# Patient Record
Sex: Female | Born: 1985 | Race: Black or African American | Hispanic: No | Smoking: Former smoker
Health system: Southern US, Community
[De-identification: ages and names within clinical notes are randomized; demographics above are authoritative.]

## PROBLEM LIST (undated history)

## (undated) DIAGNOSIS — N809 Endometriosis, unspecified: Secondary | ICD-10-CM

## (undated) DIAGNOSIS — G35 Multiple sclerosis: Secondary | ICD-10-CM

## (undated) HISTORY — PX: ANKLE SURGERY: SHX546

## (undated) HISTORY — PX: TUBAL LIGATION: SHX77

---
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
## 2007-02-10 ENCOUNTER — Emergency Department: Payer: Self-pay | Admitting: Emergency Medicine

## 2007-03-02 ENCOUNTER — Emergency Department: Payer: Self-pay | Admitting: Emergency Medicine

## 2007-06-24 ENCOUNTER — Emergency Department: Payer: Self-pay | Admitting: Emergency Medicine

## 2007-10-20 ENCOUNTER — Emergency Department: Payer: Self-pay | Admitting: Emergency Medicine

## 2008-03-18 ENCOUNTER — Emergency Department: Payer: Self-pay | Admitting: Emergency Medicine

## 2008-06-22 ENCOUNTER — Encounter: Payer: Self-pay | Admitting: Orthopedic Surgery

## 2008-06-23 ENCOUNTER — Encounter: Payer: Self-pay | Admitting: Orthopedic Surgery

## 2008-07-24 ENCOUNTER — Encounter: Payer: Self-pay | Admitting: Orthopedic Surgery

## 2008-11-30 ENCOUNTER — Emergency Department: Payer: Self-pay | Admitting: Emergency Medicine

## 2009-02-05 ENCOUNTER — Emergency Department: Payer: Self-pay | Admitting: Emergency Medicine

## 2009-08-05 ENCOUNTER — Emergency Department: Payer: Self-pay | Admitting: Internal Medicine

## 2010-05-09 ENCOUNTER — Emergency Department: Payer: Self-pay | Admitting: Emergency Medicine

## 2010-11-13 ENCOUNTER — Emergency Department: Payer: Self-pay | Admitting: Internal Medicine

## 2011-11-20 ENCOUNTER — Emergency Department: Payer: Self-pay | Admitting: Emergency Medicine

## 2011-11-20 LAB — URINALYSIS, COMPLETE
Bacteria: NONE SEEN
Blood: NEGATIVE
Ketone: NEGATIVE
Nitrite: NEGATIVE
Ph: 7 (ref 4.5–8.0)
Protein: 30
Specific Gravity: 1.029 (ref 1.003–1.030)
WBC UR: 1 /HPF (ref 0–5)

## 2011-11-20 LAB — COMPREHENSIVE METABOLIC PANEL
Albumin: 4.1 g/dL (ref 3.4–5.0)
Alkaline Phosphatase: 34 U/L — ABNORMAL LOW (ref 50–136)
Calcium, Total: 9.1 mg/dL (ref 8.5–10.1)
Glucose: 96 mg/dL (ref 65–99)
Potassium: 4 mmol/L (ref 3.5–5.1)
SGOT(AST): 17 U/L (ref 15–37)
SGPT (ALT): 16 U/L

## 2011-11-20 LAB — CBC
HCT: 42.1 % (ref 35.0–47.0)
HGB: 14.5 g/dL (ref 12.0–16.0)
MCHC: 34.4 g/dL (ref 32.0–36.0)
MCV: 98 fL (ref 80–100)
Platelet: 239 10*3/uL (ref 150–440)
RBC: 4.31 10*6/uL (ref 3.80–5.20)

## 2011-11-20 LAB — WET PREP, GENITAL

## 2012-04-21 ENCOUNTER — Emergency Department: Payer: Self-pay | Admitting: Emergency Medicine

## 2012-06-05 ENCOUNTER — Emergency Department: Payer: Self-pay | Admitting: Emergency Medicine

## 2012-06-05 LAB — URINALYSIS, COMPLETE
Bilirubin,UR: NEGATIVE
Blood: NEGATIVE
Leukocyte Esterase: NEGATIVE
Nitrite: NEGATIVE
Ph: 7 (ref 4.5–8.0)
Specific Gravity: 1.023 (ref 1.003–1.030)
Squamous Epithelial: <1

## 2012-06-05 LAB — CBC
HGB: 14 g/dL (ref 12.0–16.0)
MCHC: 35.1 g/dL (ref 32.0–36.0)
MCV: 96 fL (ref 80–100)
Platelet: 252 10*3/uL (ref 150–440)
RBC: 4.14 10*6/uL (ref 3.80–5.20)
RDW: 13 % (ref 11.5–14.5)
WBC: 10.4 10*3/uL (ref 3.6–11.0)

## 2012-06-05 LAB — WET PREP, GENITAL

## 2012-06-11 ENCOUNTER — Ambulatory Visit: Payer: Self-pay | Admitting: Obstetrics and Gynecology

## 2012-06-11 LAB — URINALYSIS, COMPLETE
Bilirubin,UR: NEGATIVE
Leukocyte Esterase: NEGATIVE
Ph: 6 (ref 4.5–8.0)
Protein: NEGATIVE
RBC,UR: <1 /HPF (ref 0–5)
Squamous Epithelial: 4

## 2012-06-11 LAB — HEMOGLOBIN: HGB: 13.2 g/dL (ref 12.0–16.0)

## 2012-06-12 ENCOUNTER — Ambulatory Visit: Payer: Self-pay | Admitting: Obstetrics and Gynecology

## 2012-06-16 ENCOUNTER — Emergency Department: Payer: Self-pay | Admitting: *Deleted

## 2012-06-16 LAB — PATHOLOGY REPORT

## 2012-06-17 LAB — COMPREHENSIVE METABOLIC PANEL
Albumin: 3.4 g/dL (ref 3.4–5.0)
Alkaline Phosphatase: 37 U/L — ABNORMAL LOW (ref 50–136)
Anion Gap: 9 (ref 7–16)
Calcium, Total: 8.6 mg/dL (ref 8.5–10.1)
EGFR (Non-African Amer.): >60
Glucose: 94 mg/dL (ref 65–99)
SGOT(AST): 23 U/L (ref 15–37)
SGPT (ALT): 31 U/L (ref 12–78)

## 2012-06-17 LAB — CBC WITH DIFFERENTIAL/PLATELET
Basophil #: 0.1 10*3/uL (ref 0.0–0.1)
Eosinophil #: 0.2 10*3/uL (ref 0.0–0.7)
Eosinophil %: 2.3 %
HGB: 12.2 g/dL (ref 12.0–16.0)
Lymphocyte #: 2.7 10*3/uL (ref 1.0–3.6)
Lymphocyte %: 25.8 %
MCHC: 34.7 g/dL (ref 32.0–36.0)
MCV: 96 fL (ref 80–100)
Neutrophil #: 6.9 10*3/uL — ABNORMAL HIGH (ref 1.4–6.5)
Platelet: 240 10*3/uL (ref 150–440)
RBC: 3.64 10*6/uL — ABNORMAL LOW (ref 3.80–5.20)
RDW: 12.7 % (ref 11.5–14.5)
WBC: 10.5 10*3/uL (ref 3.6–11.0)

## 2012-06-17 LAB — URINALYSIS, COMPLETE
Bilirubin,UR: NEGATIVE
Glucose,UR: NEGATIVE mg/dL (ref 0–75)
Leukocyte Esterase: NEGATIVE
Ph: 5 (ref 4.5–8.0)
Protein: NEGATIVE
RBC,UR: 160 /HPF (ref 0–5)
Squamous Epithelial: 3

## 2013-07-22 ENCOUNTER — Emergency Department: Payer: Self-pay | Admitting: Emergency Medicine

## 2013-07-22 LAB — CBC
HGB: 13.6 g/dL (ref 12.0–16.0)
MCH: 32.5 pg (ref 26.0–34.0)
MCV: 97 fL (ref 80–100)
RBC: 4.17 10*6/uL (ref 3.80–5.20)
RDW: 13.3 % (ref 11.5–14.5)
WBC: 12.3 10*3/uL — ABNORMAL HIGH (ref 3.6–11.0)

## 2013-07-22 LAB — COMPREHENSIVE METABOLIC PANEL
Albumin: 3.5 g/dL (ref 3.4–5.0)
Alkaline Phosphatase: 51 U/L (ref 50–136)
BUN: 20 mg/dL — ABNORMAL HIGH (ref 7–18)
Co2: 28 mmol/L (ref 21–32)
EGFR (African American): >60
Glucose: 94 mg/dL (ref 65–99)
SGPT (ALT): 17 U/L (ref 12–78)
Sodium: 138 mmol/L (ref 136–145)
Total Protein: 7 g/dL (ref 6.4–8.2)

## 2013-07-22 LAB — URINALYSIS, COMPLETE
Bilirubin,UR: NEGATIVE
Glucose,UR: NEGATIVE mg/dL (ref 0–75)
Nitrite: NEGATIVE
Ph: 5 (ref 4.5–8.0)
RBC,UR: 132 /HPF (ref 0–5)
Specific Gravity: 1.025 (ref 1.003–1.030)
Squamous Epithelial: 7

## 2013-07-22 LAB — LIPASE, BLOOD: Lipase: 88 U/L (ref 73–393)

## 2013-07-22 LAB — GC/CHLAMYDIA PROBE AMP

## 2014-04-15 ENCOUNTER — Emergency Department: Payer: Self-pay

## 2014-04-15 LAB — URINALYSIS, COMPLETE
BACTERIA: NONE SEEN
Bilirubin,UR: NEGATIVE
Blood: NEGATIVE
GLUCOSE, UR: NEGATIVE mg/dL (ref 0–75)
Ketone: NEGATIVE
Leukocyte Esterase: NEGATIVE
Nitrite: NEGATIVE
PH: 7 (ref 4.5–8.0)
PROTEIN: NEGATIVE
SPECIFIC GRAVITY: 1.013 (ref 1.003–1.030)
Squamous Epithelial: 2

## 2014-06-11 ENCOUNTER — Observation Stay: Payer: Self-pay | Admitting: Obstetrics and Gynecology

## 2014-06-11 LAB — URINALYSIS, COMPLETE
BILIRUBIN, UR: NEGATIVE
Blood: NEGATIVE
Glucose,UR: NEGATIVE mg/dL (ref 0–75)
LEUKOCYTE ESTERASE: NEGATIVE
Nitrite: NEGATIVE
Ph: 5 (ref 4.5–8.0)
Protein: NEGATIVE
RBC,UR: 1 /HPF (ref 0–5)
SPECIFIC GRAVITY: 1.027 (ref 1.003–1.030)
WBC UR: 3 /HPF (ref 0–5)

## 2014-06-23 ENCOUNTER — Emergency Department: Payer: Self-pay | Admitting: Emergency Medicine

## 2014-06-23 LAB — BASIC METABOLIC PANEL
ANION GAP: 6 — AB (ref 7–16)
BUN: 12 mg/dL (ref 7–18)
CALCIUM: 8.5 mg/dL (ref 8.5–10.1)
CO2: 28 mmol/L (ref 21–32)
CREATININE: 0.74 mg/dL (ref 0.60–1.30)
Chloride: 103 mmol/L (ref 98–107)
EGFR (African American): >60
EGFR (Non-African Amer.): >60
Glucose: 59 mg/dL — ABNORMAL LOW (ref 65–99)
Osmolality: 271 (ref 275–301)
POTASSIUM: 3.7 mmol/L (ref 3.5–5.1)
SODIUM: 137 mmol/L (ref 136–145)

## 2014-06-23 LAB — TROPONIN I: Troponin-I: <0.02 ng/mL

## 2014-06-23 LAB — CBC
HCT: 39.2 % (ref 35.0–47.0)
HGB: 13.1 g/dL (ref 12.0–16.0)
MCH: 32.6 pg (ref 26.0–34.0)
MCHC: 33.4 g/dL (ref 32.0–36.0)
MCV: 98 fL (ref 80–100)
Platelet: 241 10*3/uL (ref 150–440)
RBC: 4.01 10*6/uL (ref 3.80–5.20)
RDW: 13 % (ref 11.5–14.5)
WBC: 15.2 10*3/uL — ABNORMAL HIGH (ref 3.6–11.0)

## 2014-07-07 ENCOUNTER — Observation Stay: Payer: Self-pay | Admitting: Obstetrics and Gynecology

## 2014-09-05 ENCOUNTER — Observation Stay: Payer: Self-pay | Admitting: Obstetrics and Gynecology

## 2014-09-05 LAB — URINALYSIS, COMPLETE
Bacteria: NONE SEEN
Bilirubin,UR: NEGATIVE
Blood: NEGATIVE
Glucose,UR: NEGATIVE mg/dL (ref 0–75)
KETONE: NEGATIVE
Leukocyte Esterase: NEGATIVE
Nitrite: NEGATIVE
PH: 7 (ref 4.5–8.0)
Protein: NEGATIVE
RBC,UR: NONE SEEN /HPF (ref 0–5)
Specific Gravity: 1.005 (ref 1.003–1.030)
WBC UR: <1 /HPF (ref 0–5)

## 2014-09-05 LAB — DRUG SCREEN, URINE
AMPHETAMINES, UR SCREEN: NEGATIVE (ref ?–1000)
BENZODIAZEPINE, UR SCRN: NEGATIVE (ref ?–200)
Barbiturates, Ur Screen: NEGATIVE (ref ?–200)
CANNABINOID 50 NG, UR ~~LOC~~: NEGATIVE (ref ?–50)
COCAINE METABOLITE, UR ~~LOC~~: NEGATIVE (ref ?–300)
MDMA (Ecstasy)Ur Screen: NEGATIVE (ref ?–500)
Methadone, Ur Screen: NEGATIVE (ref ?–300)
Opiate, Ur Screen: NEGATIVE (ref ?–300)
Phencyclidine (PCP) Ur S: NEGATIVE (ref ?–25)
Tricyclic, Ur Screen: NEGATIVE (ref ?–1000)

## 2014-10-02 ENCOUNTER — Observation Stay: Payer: Self-pay

## 2014-10-02 LAB — URINALYSIS, COMPLETE
BLOOD: NEGATIVE
Bacteria: NONE SEEN
Bilirubin,UR: NEGATIVE
Glucose,UR: NEGATIVE mg/dL (ref 0–75)
Ketone: NEGATIVE
NITRITE: NEGATIVE
PH: 8 (ref 4.5–8.0)
Protein: NEGATIVE
RBC,UR: NONE SEEN /HPF (ref 0–5)
Specific Gravity: 1.004 (ref 1.003–1.030)
Squamous Epithelial: <1
WBC UR: 11 /HPF (ref 0–5)

## 2014-10-10 ENCOUNTER — Inpatient Hospital Stay: Payer: Self-pay | Admitting: Obstetrics and Gynecology

## 2014-10-10 LAB — CBC WITH DIFFERENTIAL/PLATELET
BASOS ABS: 0.1 10*3/uL (ref 0.0–0.1)
Basophil %: 0.5 %
Eosinophil #: 0 10*3/uL (ref 0.0–0.7)
Eosinophil %: 0.3 %
HCT: 37.1 % (ref 35.0–47.0)
HGB: 12.3 g/dL (ref 12.0–16.0)
LYMPHS ABS: 1.4 10*3/uL (ref 1.0–3.6)
Lymphocyte %: 9.2 %
MCH: 32.2 pg (ref 26.0–34.0)
MCHC: 33.2 g/dL (ref 32.0–36.0)
MCV: 97 fL (ref 80–100)
MONO ABS: 1.1 x10 3/mm — AB (ref 0.2–0.9)
MONOS PCT: 7.4 %
NEUTROS ABS: 12.4 10*3/uL — AB (ref 1.4–6.5)
Neutrophil %: 82.6 %
PLATELETS: 269 10*3/uL (ref 150–440)
RBC: 3.83 10*6/uL (ref 3.80–5.20)
RDW: 13.6 % (ref 11.5–14.5)
WBC: 15 10*3/uL — AB (ref 3.6–11.0)

## 2014-10-10 LAB — GC/CHLAMYDIA PROBE AMP

## 2014-10-11 LAB — HEMATOCRIT: HCT: 33.6 % — ABNORMAL LOW (ref 35.0–47.0)

## 2015-01-10 NOTE — Op Note (Signed)
PATIENT NAME:  Christie Oconnor, Christie Oconnor MR#:  [id number] DATE OF BIRTH:  08/08/1986  DATE OF PROCEDURE:  06/12/2012  PREOPERATIVE DIAGNOSIS: Incomplete spontaneous abortion.   POSTOPERATIVE DIAGNOSIS: Incomplete spontaneous abortion.   PROCEDURE: Suction D and C.   SURGEON: Ricky L. Logan Bores, MD   ANESTHESIA: General endotracheal.   FINDINGS: Blue-gray tissue consistent with products of conception.   ESTIMATED BLOOD LOSS: Minimal.   COMPLICATIONS: None.   DRAINS: In and out catheterization with a red rubber at the end of the case with 200 mL of urine.   PROCEDURE IN DETAIL: The patient was found to have an incomplete abortion. I discussed the options, and she elected to proceed with proceed with dilation and curettage. Consent was signed. She was taken to the Operating Room and placed in supine position where anesthesia was initiated. She received 1 gram of Ancef preoperatively. She was placed in the dorsal lithotomy position, prepped and draped in the usual sterile fashion. The cervix was visualized, grasped with a single-tooth tenaculum, dilated to permit a #8 suction curette which was passed with immediate return of tissue. Gentle curettage with a sharp curette was carried out which showed good uterine cry. One additional pass of the suction curette was made with passage of a small remaining amount of tissue, and again good uterine cry was noted throughout. The instruments were removed. The uterus and cervix were seen to be hemostatic. The bladder was drained, and the patient was turned to supine position and left in the care of Anesthesia.     I anticipate a routine postoperative course. The patient will be started on doxycycline tomorrow and given pain medicine to be taken as needed.  Routine precautions and follow-up in my office in two weeks.   ____________________________ Reatha Harps. Logan Bores, MD rle:cbb D: 06/12/2012 15:19:07 ET T: 06/12/2012 17:33:00 ET JOB#: [id number]  cc: Ricky L.  Logan Bores, MD, <Dictator> Augustina Mood MD ELECTRONICALLY SIGNED 06/14/2012 11:29

## 2015-01-22 NOTE — Op Note (Signed)
PATIENT NAME:  Christie Oconnor, Christie Oconnor MR#:  [id number] DATE OF BIRTH:  05/26/1986  DATE OF PROCEDURE:  10/10/2014  PREOPERATIVE DIAGNOSES: Nonreassuring fetal heart tracing.   POSTOPERATIVE DIAGNOSIS: Nonreassuring fetal heart tracing, status post primary low transverse cesarean section.  PROCEDURE:  Primary low transverse cesarean section.   ANESTHESIA: Spinal.   SURGEON:  Hershall Benkert S. Valentino Saxon, MD   ESTIMATED BLOOD LOSS: 400 mL.   URINE OUTPUT: 200 mL.   INTRAVENOUS FLUIDS: 1000 mL.   COMPLICATIONS: None.   FINDINGS: There was clear amniotic fluid at rupture. Nuchal cord x 1 that was reducible. Small anterior subserosal fundal fibroid approximately 1 x 1 cm. There were filmy adhesions of  bilateral adnexa, otherwise normal-appearing uterine outline, tubes and ovaries. There was a normal-appearing placenta.   SPECIMEN: Cord gas.   CONDITION: Stable.   DESCRIPTION OF PROCEDURE:  The patient was taken to the operating room where she was placed under spinal anesthesia without difficulty. A timeout was held and the above information was confirmed. After induction of anesthesia, the patient received 2 grams of Ancef. She was then prepped and draped in normal sterile fashion in dorsal supine position with leftward tilt. The spinal was found to be adequate.   Next, a Pfannenstiel incision was made and carried down through the subcutaneous tissue to the fascia with the Bovie. The fascia was then incised in the midline and incision was extended laterally.  The superior aspect of the fascial incision was then grasped with Kocher clamps, elevated, and the rectus muscles were dissected off superiorly using the Mayo scissors.   Attention was then turned inferiorly, which in a similar fashion was grasped, tented up with Kocher clamps, and the rectus muscles dissected off bluntly and using the Mayo scissors, the rectus muscles were then separated in the midline. The peritoneum was identified and entered  bluntly. The peritoneal incision was then extended superiorly and inferiorly with good visualization of the bladder. The uterovesical peritoneal reflection was then incised transversely and the bladder flap was bluntly freed from the lower uterine segment. The bladder blade was then reinserted. Low transverse uterine incision was then made along the lower uterine segment with the scalpel. The uterine incision was then extended laterally and superiorly bluntly. The bladder blade was removed and the infant's head was delivered atraumatically. There was a nuchal cord x 1 that was noted which was reducible. The nose and mouth were suctioned. The cord was clamped and cut and infant was handed off to the pediatricians, delivered from cephalic presentation was a 2870 gram female with Apgars of 8 and 9. After the umbilical cord was clamped and cut, cord gas was initially obtained due to the appearance of fetal distress. This was collected and sent. After this, the placenta was then removed manually and intact and appeared normal. The uterus was exteriorized and cleared of all clots and debris. The uterine outline, tubes, and ovaries appeared normal with the exception of the noted subserosal fundal fibroid on the anterior aspect of the uterus.   The uterine incision was then closed using running locked sutures of 0 Vicryl. Hemostasis was observed. A second layer of 0 Vicryl was used for an imbricating layer for added hemostasis. Next, the filmy adhesions that were noted on the adnexa were then taken down using the Bovie with good hemostasis noted. The uterus was then returned to the patient's abdomen. Lavage was carried out until clear. The gutters were cleared of all clots. The peritoneal edges were then approximated using  a 3-0 Vicryl, also incorporating the rectus muscles. The fascia was then reapproximated with running suture of 1-0 Vicryl. The skin was approximated with 4-0 Monocryl. Liquiband dressing was then placed  over the incision. A Lidoderm patch was placed above and below the incision line and a pressure dressing was placed. Instrument, sponge, and needle counts were correct prior to abdominal closure and at the conclusion of the case.    ____________________________ Jacques Earthly. Valentino Saxon, MD asc:LT D: 10/10/2014 21:36:05 ET T: 10/10/2014 21:52:20 ET JOB#: [id number]  cc: Jacques Earthly. Valentino Saxon, MD, <Dictator> Fabian November MD ELECTRONICALLY SIGNED 10/17/2014 11:24
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
## 2015-06-05 ENCOUNTER — Other Ambulatory Visit: Payer: Self-pay | Admitting: Family Medicine

## 2015-06-05 DIAGNOSIS — Z3482 Encounter for supervision of other normal pregnancy, second trimester: Secondary | ICD-10-CM

## 2015-06-08 ENCOUNTER — Ambulatory Visit
Admission: RE | Admit: 2015-06-08 | Discharge: 2015-06-08 | Disposition: A | Discharge status: Home / Self Care | Payer: Medicaid Other | Source: Ambulatory Visit | Attending: Family Medicine | Admitting: Family Medicine

## 2015-06-08 ENCOUNTER — Ambulatory Visit: Payer: Self-pay

## 2015-06-08 DIAGNOSIS — Z3A19 19 weeks gestation of pregnancy: Secondary | ICD-10-CM | POA: Diagnosis not present

## 2015-06-08 DIAGNOSIS — Z3482 Encounter for supervision of other normal pregnancy, second trimester: Secondary | ICD-10-CM | POA: Diagnosis present

## 2018-12-13 ENCOUNTER — Other Ambulatory Visit: Payer: Self-pay

## 2018-12-13 ENCOUNTER — Emergency Department: Payer: Self-pay

## 2018-12-13 ENCOUNTER — Emergency Department
Admission: EM | Admit: 2018-12-13 | Discharge: 2018-12-13 | Disposition: A | Discharge status: Home / Self Care | Payer: Self-pay | Attending: Emergency Medicine | Admitting: Emergency Medicine

## 2018-12-13 ENCOUNTER — Encounter: Payer: Self-pay | Admitting: Emergency Medicine

## 2018-12-13 DIAGNOSIS — Y939 Activity, unspecified: Secondary | ICD-10-CM | POA: Insufficient documentation

## 2018-12-13 DIAGNOSIS — Y999 Unspecified external cause status: Secondary | ICD-10-CM | POA: Insufficient documentation

## 2018-12-13 DIAGNOSIS — Y929 Unspecified place or not applicable: Secondary | ICD-10-CM | POA: Insufficient documentation

## 2018-12-13 DIAGNOSIS — S93401A Sprain of unspecified ligament of right ankle, initial encounter: Secondary | ICD-10-CM | POA: Insufficient documentation

## 2018-12-13 DIAGNOSIS — Y33XXXA Other specified events, undetermined intent, initial encounter: Secondary | ICD-10-CM | POA: Insufficient documentation

## 2018-12-13 NOTE — ED Notes (Signed)
.  This RN reviewed discharge instructions, follow-up care, OTC pain relievers, splint use, crutch use, cryotherapy, and need for elevation with patient. Patient verbalized understanding of all reviewed information.  Patient stable, with no distress noted at this time.
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
## 2018-12-13 NOTE — ED Triage Notes (Signed)
Pt reports right ankle pain for 48 hrs. Pt reports poor balance. Pt talks in complete sentences no distress noted.
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
## 2019-03-18 ENCOUNTER — Emergency Department
Admission: EM | Admit: 2019-03-18 | Discharge: 2019-03-18 | Disposition: A | Discharge status: Home / Self Care | Payer: Self-pay | Attending: Emergency Medicine | Admitting: Emergency Medicine

## 2019-03-18 ENCOUNTER — Encounter: Payer: Self-pay | Admitting: Emergency Medicine

## 2019-03-18 ENCOUNTER — Other Ambulatory Visit: Payer: Self-pay

## 2019-03-18 DIAGNOSIS — J020 Streptococcal pharyngitis: Secondary | ICD-10-CM | POA: Insufficient documentation

## 2019-03-18 DIAGNOSIS — Z20828 Contact with and (suspected) exposure to other viral communicable diseases: Secondary | ICD-10-CM | POA: Insufficient documentation

## 2019-03-18 DIAGNOSIS — F17200 Nicotine dependence, unspecified, uncomplicated: Secondary | ICD-10-CM | POA: Insufficient documentation

## 2019-03-18 LAB — CBC
HCT: 39.6 % (ref 36.0–46.0)
Hemoglobin: 13.2 g/dL (ref 12.0–15.0)
MCH: 32.9 pg (ref 26.0–34.0)
MCHC: 33.3 g/dL (ref 30.0–36.0)
MCV: 98.8 fL (ref 80.0–100.0)
Platelets: 236 10*3/uL (ref 150–400)
RBC: 4.01 MIL/uL (ref 3.87–5.11)
RDW: 14.1 % (ref 11.5–15.5)
WBC: 18.2 10*3/uL — ABNORMAL HIGH (ref 4.0–10.5)
nRBC: 0 % (ref 0.0–0.2)

## 2019-03-18 LAB — COMPREHENSIVE METABOLIC PANEL
ALT: 15 U/L (ref 0–44)
AST: 15 U/L (ref 15–41)
Albumin: 3.9 g/dL (ref 3.5–5.0)
Alkaline Phosphatase: 51 U/L (ref 38–126)
Anion gap: 9 (ref 5–15)
BUN: 12 mg/dL (ref 6–20)
CO2: 26 mmol/L (ref 22–32)
Calcium: 8.8 mg/dL — ABNORMAL LOW (ref 8.9–10.3)
Chloride: 102 mmol/L (ref 98–111)
Creatinine, Ser: 0.83 mg/dL (ref 0.44–1.00)
GFR calc Af Amer: >60 mL/min (ref >60)
GFR calc non Af Amer: >60 mL/min (ref >60)
Glucose, Bld: 112 mg/dL — ABNORMAL HIGH (ref 70–99)
Potassium: 3.3 mmol/L — ABNORMAL LOW (ref 3.5–5.1)
Sodium: 137 mmol/L (ref 135–145)
Total Bilirubin: 1 mg/dL (ref 0.3–1.2)
Total Protein: 7.7 g/dL (ref 6.5–8.1)

## 2019-03-18 LAB — GROUP A STREP BY PCR: Group A Strep by PCR: DETECTED — AB

## 2019-03-18 LAB — SARS CORONAVIRUS 2 BY RT PCR (HOSPITAL ORDER, PERFORMED IN ~~LOC~~ HOSPITAL LAB): SARS Coronavirus 2: NEGATIVE

## 2019-03-18 LAB — LIPASE, BLOOD: Lipase: 29 U/L (ref 11–51)

## 2019-03-18 MED ORDER — CEPHALEXIN 500 MG PO CAPS
500.0000 mg | ORAL_CAPSULE | Freq: Once | ORAL | Status: AC
  Administered 2019-03-18: 13:00:00 500 mg via ORAL
  Filled 2019-03-18: qty 1

## 2019-03-18 MED ORDER — SODIUM CHLORIDE 0.9% FLUSH: 3.0000 mL | Freq: Once | INTRAVENOUS | Status: DC

## 2019-03-18 MED ORDER — CEPHALEXIN 500 MG PO CAPS: 500.0000 mg | ORAL_CAPSULE | Freq: Two times a day (BID) | ORAL | 0 refills | Status: AC

## 2019-03-18 MED ORDER — ACETAMINOPHEN 500 MG PO TABS
1000.0000 mg | ORAL_TABLET | Freq: Once | ORAL | Status: AC
  Administered 2019-03-18: 12:00:00 1000 mg via ORAL
  Filled 2019-03-18: qty 2

## 2019-03-18 NOTE — ED Triage Notes (Signed)
First RN Note: Pt presents to ED via POV with c/o sore throat, night sweats, and reports 3 days ago "I was vomiting real bad". Pt denies active vomiting at this time. Pt c/o swelling to L side of her neck at this time. Pt noted to have hoarse voice at this time.

## 2019-03-18 NOTE — ED Provider Notes (Signed)
[facility] Emergency Department Provider Note  Time seen: 11:20 AM  I have reviewed the triage vital signs and the nursing notes.   HISTORY  Chief Complaint Sore Throat and Emesis    HPI Christie Oconnor is a 33 y.o. female with no significant past medical history presents to the emergency department for sore throat fever slight cough.  According to the patient for the past 3 days she has been experiencing a sore throat, left greater than right along with low-grade fevers, intermittent body aches and a slight cough.  Patient denies any shortness of breath.  Denies any chest pain.  Denies any history of strep throat.  Denies any known sick contacts.   History reviewed. No pertinent past medical history.  There are no active problems to display for this patient.   Past Surgical History:  Procedure Laterality Date  . ANKLE SURGERY Left   . CESAREAN SECTION     x 2  . TUBAL LIGATION      Prior to Admission medications   Not on File    No Known Allergies  No family history on file.  Social History Social History   Tobacco Use  . Smoking status: Current Every Day Smoker  . Smokeless tobacco: Never Used  Substance Use Topics  . Alcohol use: Yes  . Drug use: Not on file    Review of Systems Constitutional: Fever x3 days ENT: Sore throat left greater than right Cardiovascular: Negative for chest pain. Respiratory: Negative for shortness of breath.  Slight cough per patient. Gastrointestinal: Negative for abdominal pain Musculoskeletal: Negative for musculoskeletal complaints Skin: Negative for skin complaints  Neurological: Negative for headache All other ROS negative  ____________________________________________   PHYSICAL EXAM:  VITAL SIGNS: ED Triage Vitals  Enc Vitals Group     BP 03/18/19 1002 120/78     Pulse Rate 03/18/19 1002 90     Resp 03/18/19 1002 16     Temp 03/18/19 1002 99.3 F (37.4 C)     Temp Source 03/18/19  1002 Oral     SpO2 03/18/19 1002 99 %     Weight 03/18/19 1001 168 lb (76.2 kg)     Height 03/18/19 1001 5\' 7"  (1.702 m)     Head Circumference --      Peak Flow --      Pain Score 03/18/19 1003 8     Pain Loc --      Pain Edu? --      Excl. in GC? --    Constitutional: Alert and oriented. Well appearing and in no distress. Eyes: Normal exam ENT      Head: Normocephalic and atraumatic.      Mouth/Throat: Mucous membranes are moist.  Patient has erythematous tonsils bilaterally with bilateral exudate.  No unilateral hypertrophy, midline uvula. Cardiovascular: Normal rate, regular rhythm.  Respiratory: Normal respiratory effort without tachypnea nor retractions. Breath sounds are clear Gastrointestinal: Soft and nontender. No distention.  Musculoskeletal: Nontender with normal range of motion in all extremities.  Neurologic:  Normal speech and language. No gross focal neurologic deficits Skin:  Skin is warm, dry and intact.  Psychiatric: Mood and affect are normal.   ____________________________________________   INITIAL IMPRESSION / ASSESSMENT AND PLAN / ED COURSE  Pertinent labs & imaging results that were available during my care of the patient were reviewed by me and considered in my medical decision making (see chart for details).   Patient presents emergency department for sore throat  fever chills and body aches.  Differential would include pharyngitis, streptococcal pharyngitis, coronavirus, tonsillitis.  We will check for strep throat, we will check for coronavirus and continue to closely monitor.  Patient's lab work shows a moderate leukocytosis largely within normal limits otherwise.   Patient's strep test is positive.  Cover test is negative.  We will discharge on Keflex.  Patient agreeable to plan of care.  Christie Oconnor was evaluated in Emergency Department on 03/18/2019 for the symptoms described in the history of present illness. She was evaluated in the context  of the global COVID-19 pandemic, which necessitated consideration that the patient might be at risk for infection with the SARS-CoV-2 virus that causes COVID-19. Institutional protocols and algorithms that pertain to the evaluation of patients at risk for COVID-19 are in a state of rapid change based on information released by regulatory bodies including the CDC and federal and state organizations. These policies and algorithms were followed during the patient's care in the ED.  ____________________________________________   FINAL CLINICAL IMPRESSION(S) / ED DIAGNOSES  Pharyngitis Streptococcal pharyngitis   Harvest Dark, MD 03/18/19 1233

## 2019-03-18 NOTE — ED Notes (Signed)
This RN notified by lab that blood sent down had incorrect stickers. Pt pulled back by Judson Roch, EDT to recollect blood.

## 2019-07-15 ENCOUNTER — Other Ambulatory Visit: Payer: Self-pay

## 2019-07-15 ENCOUNTER — Encounter: Payer: Self-pay | Admitting: Radiology

## 2019-07-15 ENCOUNTER — Emergency Department: Payer: Medicaid Other

## 2019-07-15 ENCOUNTER — Inpatient Hospital Stay
Admission: EM | Admit: 2019-07-15 | Discharge: 2019-07-19 | DRG: 060 | Disposition: A | Discharge status: Home / Self Care | Payer: Medicaid Other | Attending: Internal Medicine | Admitting: Internal Medicine

## 2019-07-15 DIAGNOSIS — G8194 Hemiplegia, unspecified affecting left nondominant side: Secondary | ICD-10-CM | POA: Diagnosis present

## 2019-07-15 DIAGNOSIS — Z818 Family history of other mental and behavioral disorders: Secondary | ICD-10-CM

## 2019-07-15 DIAGNOSIS — G35 Multiple sclerosis: Principal | ICD-10-CM | POA: Diagnosis present

## 2019-07-15 DIAGNOSIS — F1210 Cannabis abuse, uncomplicated: Secondary | ICD-10-CM | POA: Diagnosis present

## 2019-07-15 DIAGNOSIS — F17200 Nicotine dependence, unspecified, uncomplicated: Secondary | ICD-10-CM | POA: Diagnosis present

## 2019-07-15 DIAGNOSIS — R531 Weakness: Secondary | ICD-10-CM

## 2019-07-15 DIAGNOSIS — R569 Unspecified convulsions: Secondary | ICD-10-CM | POA: Diagnosis present

## 2019-07-15 DIAGNOSIS — R29705 NIHSS score 5: Secondary | ICD-10-CM | POA: Diagnosis present

## 2019-07-15 DIAGNOSIS — Z20828 Contact with and (suspected) exposure to other viral communicable diseases: Secondary | ICD-10-CM | POA: Diagnosis present

## 2019-07-15 DIAGNOSIS — I639 Cerebral infarction, unspecified: Secondary | ICD-10-CM | POA: Diagnosis present

## 2019-07-15 DIAGNOSIS — Z833 Family history of diabetes mellitus: Secondary | ICD-10-CM | POA: Diagnosis not present

## 2019-07-15 DIAGNOSIS — R29810 Facial weakness: Secondary | ICD-10-CM | POA: Diagnosis present

## 2019-07-15 DIAGNOSIS — Z823 Family history of stroke: Secondary | ICD-10-CM | POA: Diagnosis not present

## 2019-07-15 DIAGNOSIS — Z825 Family history of asthma and other chronic lower respiratory diseases: Secondary | ICD-10-CM | POA: Diagnosis not present

## 2019-07-15 DIAGNOSIS — R471 Dysarthria and anarthria: Secondary | ICD-10-CM | POA: Diagnosis present

## 2019-07-15 LAB — DIFFERENTIAL
Abs Immature Granulocytes: 0.03 10*3/uL (ref 0.00–0.07)
Basophils Absolute: 0 10*3/uL (ref 0.0–0.1)
Basophils Relative: 0 %
Eosinophils Absolute: 0.2 10*3/uL (ref 0.0–0.5)
Eosinophils Relative: 2 %
Immature Granulocytes: 0 %
Lymphocytes Relative: 28 %
Lymphs Abs: 2.9 10*3/uL (ref 0.7–4.0)
Monocytes Absolute: 0.6 10*3/uL (ref 0.1–1.0)
Monocytes Relative: 6 %
Neutro Abs: 6.6 10*3/uL (ref 1.7–7.7)
Neutrophils Relative %: 64 %

## 2019-07-15 LAB — CBC
HCT: 38.9 % (ref 36.0–46.0)
Hemoglobin: 13.2 g/dL (ref 12.0–15.0)
MCH: 32.4 pg (ref 26.0–34.0)
MCHC: 33.9 g/dL (ref 30.0–36.0)
MCV: 95.3 fL (ref 80.0–100.0)
Platelets: 257 10*3/uL (ref 150–400)
RBC: 4.08 MIL/uL (ref 3.87–5.11)
RDW: 13.9 % (ref 11.5–15.5)
WBC: 10.3 10*3/uL (ref 4.0–10.5)
nRBC: 0 % (ref 0.0–0.2)

## 2019-07-15 LAB — COMPREHENSIVE METABOLIC PANEL
ALT: 15 U/L (ref 0–44)
AST: 21 U/L (ref 15–41)
Albumin: 3.8 g/dL (ref 3.5–5.0)
Alkaline Phosphatase: 38 U/L (ref 38–126)
Anion gap: 13 (ref 5–15)
BUN: 20 mg/dL (ref 6–20)
CO2: 21 mmol/L — ABNORMAL LOW (ref 22–32)
Calcium: 8.8 mg/dL — ABNORMAL LOW (ref 8.9–10.3)
Chloride: 103 mmol/L (ref 98–111)
Creatinine, Ser: 0.88 mg/dL (ref 0.44–1.00)
GFR calc Af Amer: >60 mL/min (ref >60)
GFR calc non Af Amer: >60 mL/min (ref >60)
Glucose, Bld: 76 mg/dL (ref 70–99)
Potassium: 3.7 mmol/L (ref 3.5–5.1)
Sodium: 137 mmol/L (ref 135–145)
Total Bilirubin: 0.7 mg/dL (ref 0.3–1.2)
Total Protein: 7.1 g/dL (ref 6.5–8.1)

## 2019-07-15 LAB — URINALYSIS, ROUTINE W REFLEX MICROSCOPIC
Bacteria, UA: NONE SEEN
Bilirubin Urine: NEGATIVE
Glucose, UA: >=500 mg/dL — AB
Hgb urine dipstick: NEGATIVE
Ketones, ur: NEGATIVE mg/dL
Leukocytes,Ua: NEGATIVE
Nitrite: NEGATIVE
Protein, ur: NEGATIVE mg/dL
Specific Gravity, Urine: 1.021 (ref 1.005–1.030)
pH: 6 (ref 5.0–8.0)

## 2019-07-15 LAB — GLUCOSE, CAPILLARY
Glucose-Capillary: 66 mg/dL — ABNORMAL LOW (ref 70–99)
Glucose-Capillary: 83 mg/dL (ref 70–99)

## 2019-07-15 LAB — PROTIME-INR
INR: 1 (ref 0.8–1.2)
Prothrombin Time: 13.1 seconds (ref 11.4–15.2)

## 2019-07-15 LAB — APTT: aPTT: 31 seconds (ref 24–36)

## 2019-07-15 MED ORDER — LEVETIRACETAM IN NACL 1000 MG/100ML IV SOLN
1000.0000 mg | Freq: Once | INTRAVENOUS | Status: AC
  Administered 2019-07-15: 1000 mg via INTRAVENOUS
  Filled 2019-07-15: qty 100

## 2019-07-15 MED ORDER — ASPIRIN EC 325 MG PO TBEC
325.0000 mg | DELAYED_RELEASE_TABLET | Freq: Every day | ORAL | Status: DC
  Administered 2019-07-16 – 2019-07-19 (×4): 325 mg via ORAL
  Filled 2019-07-15 (×4): qty 1

## 2019-07-15 MED ORDER — PROCHLORPERAZINE EDISYLATE 10 MG/2ML IJ SOLN
10.0000 mg | Freq: Once | INTRAMUSCULAR | Status: AC
  Administered 2019-07-15: 23:00:00 10 mg via INTRAVENOUS
  Filled 2019-07-15: qty 2

## 2019-07-15 MED ORDER — ENOXAPARIN SODIUM 40 MG/0.4ML ~~LOC~~ SOLN
40.0000 mg | SUBCUTANEOUS | Status: DC
  Administered 2019-07-16 – 2019-07-18 (×4): 40 mg via SUBCUTANEOUS
  Filled 2019-07-15 (×4): qty 0.4

## 2019-07-15 MED ORDER — ACETAMINOPHEN 650 MG RE SUPP: 650.0000 mg | RECTAL | Status: DC | PRN

## 2019-07-15 MED ORDER — ONDANSETRON HCL 4 MG/2ML IJ SOLN: 4.0000 mg | INTRAMUSCULAR | Status: DC | PRN

## 2019-07-15 MED ORDER — LEVETIRACETAM 500 MG PO TABS
1000.0000 mg | ORAL_TABLET | Freq: Two times a day (BID) | ORAL | Status: DC
  Administered 2019-07-16 – 2019-07-17 (×3): 1000 mg via ORAL
  Filled 2019-07-15 (×4): qty 2

## 2019-07-15 MED ORDER — ACETAMINOPHEN 325 MG PO TABS
650.0000 mg | ORAL_TABLET | Freq: Once | ORAL | Status: AC
  Administered 2019-07-15: 650 mg via ORAL
  Filled 2019-07-15: qty 2

## 2019-07-15 MED ORDER — STROKE: EARLY STAGES OF RECOVERY BOOK
Freq: Once | Status: AC
  Administered 2019-07-16: 02:00:00

## 2019-07-15 MED ORDER — ACETAMINOPHEN 160 MG/5ML PO SOLN
650.0000 mg | ORAL | Status: DC | PRN
  Filled 2019-07-15: qty 20.3

## 2019-07-15 MED ORDER — ASPIRIN 81 MG PO CHEW
81.0000 mg | CHEWABLE_TABLET | Freq: Once | ORAL | Status: AC
  Administered 2019-07-15: 23:00:00 81 mg via ORAL
  Filled 2019-07-15: qty 1

## 2019-07-15 MED ORDER — ASPIRIN 300 MG RE SUPP: 300.0000 mg | Freq: Every day | RECTAL | Status: DC

## 2019-07-15 MED ORDER — IOHEXOL 350 MG/ML SOLN
75.0000 mL | Freq: Once | INTRAVENOUS | Status: AC | PRN
  Administered 2019-07-15: 75 mL via INTRAVENOUS

## 2019-07-15 MED ORDER — SODIUM CHLORIDE 0.9 % IV SOLN
INTRAVENOUS | Status: DC
  Administered 2019-07-16 (×2): via INTRAVENOUS

## 2019-07-15 MED ORDER — DEXTROSE 50 % IV SOLN
1.0000 | Freq: Once | INTRAVENOUS | Status: AC
  Administered 2019-07-15: 50 mL via INTRAVENOUS

## 2019-07-15 MED ORDER — SENNOSIDES-DOCUSATE SODIUM 8.6-50 MG PO TABS: 1.0000 | ORAL_TABLET | Freq: Every evening | ORAL | Status: DC | PRN

## 2019-07-15 MED ORDER — ACETAMINOPHEN 325 MG PO TABS
650.0000 mg | ORAL_TABLET | ORAL | Status: DC | PRN
  Administered 2019-07-17 – 2019-07-19 (×4): 650 mg via ORAL
  Filled 2019-07-15 (×4): qty 2

## 2019-07-15 NOTE — ED Notes (Signed)
Patient transported to CT 

## 2019-07-15 NOTE — ED Notes (Signed)
Patient declines tPA, code stroke cancelled

## 2019-07-15 NOTE — ED Notes (Signed)
ED TO INPATIENT HANDOFF REPORT  ED Nurse Name and Phone #: Betti CruzKate  S Name/Age/Gender Christie EhlersJennifer R Oconnor 33 y.o. female Room/Bed: ED08A/ED08A  Code Status   Code Status: Full Code  Home/SNF/Other Home {Patient oriented WJ:XBJYto:self, place, time, situation Is this baseline? Yes   Triage Complete: Triage complete  Chief Complaint AMS  Triage Note Pt to ED from home via [facility] c/o AMS that started at [employer]1900 tonight.  Per EMS patient was sitting on couch speaking with mom and suddenly started acting differently.  Upon arrival patient slow to respond verbally, decreased attention.  EMS vitals 120/90 BP, 70 HR, 98% RA, 74 CBG, 98.7 temp.  Upon arrival patient assessed by EDP and found to have weakness in left arm and leg, states tingling and decreased left sided sensation.  Pt also states had several sips of alcohol, possibly margarita.   Allergies No Known Allergies  Level of Care/Admitting Diagnosis ED Disposition    ED Disposition Condition Comment   Admit  [facility]ospital [facility] [[id number]]  Level of Care: Med-Surg [16]  Covid Evaluation: Asymptomatic Screening Protocol (No Symptoms)  Diagnosis: CVA (cerebral vascular accident) [facility](HCC) [[id number]]  Admitting Physician: Hannah BeatMANSY, JAN A [[id number]]  Attending Physician: Hannah BeatMANSY, JAN A [[id number]]  Estimated length of stay: past midnight tomorrow  Certification:: I certify this patient will need inpatient services for at least 2 midnights  PT Class (Do Not Modify): Inpatient [101]  PT Acc Code (Do Not Modify): Private [1]       B Medical/Surgery History History reviewed. No pertinent past medical history. Past Surgical History:  Procedure Laterality Date  . ANKLE SURGERY Left   . CESAREAN SECTION     x 2  . TUBAL LIGATION       A IV Location/Drains/Wounds Patient Lines/Drains/Airways Status   Active Line/Drains/Airways    Name:   Placement date:   Placement time:   Site:   Days:   Peripheral IV  07/15/19 Left Antecubital   07/15/19    2147    Antecubital   less than 1          Intake/Output Last 24 hours No intake or output data in the 24 hours ending 07/15/19 2350  Labs/Imaging Results for orders placed or performed during the hospital encounter of 07/15/19 (from the past 48 hour(s))  Glucose, capillary     Status: Abnormal   Collection Time: 07/15/19  9:22 PM  Result Value Ref Range   Glucose-Capillary 66 (L) 70 - 99 mg/dL  Protime-INR     Status: None   Collection Time: 07/15/19  9:27 PM  Result Value Ref Range   Prothrombin Time 13.1 11.4 - 15.2 seconds   INR 1.0 0.8 - 1.2    Comment: (NOTE) INR goal varies based on device and disease states. Performed at [facility], [address] [place], [place] [address]   APTT     Status: None   Collection Time: 07/15/19  9:27 PM  Result Value Ref Range   aPTT 31 24 - 36 seconds    Comment: Performed at [facility], [address] [place], [place] [address]  CBC     Status: None   Collection Time: 07/15/19  9:27 PM  Result Value Ref Range   WBC 10.3 4.0 - 10.5 K/uL   RBC 4.08 3.87 - 5.11 MIL/uL   Hemoglobin 13.2 12.0 - 15.0 g/dL   HCT 01.038.9 27.236.0 - 53.646.0 %   MCV 95.3 80.0 -  100.0 fL   MCH 32.4 26.0 - 34.0 pg   MCHC 33.9 30.0 - 36.0 g/dL   RDW 47.813.9 29.511.5 - 62.115.5 %   Platelets 257 150 - 400 K/uL   nRBC 0.0 0.0 - 0.2 %    Comment: Performed at [facility], [address] [place], [place] [address]  Differential     Status: None   Collection Time: 07/15/19  9:27 PM  Result Value Ref Range   Neutrophils Relative % 64 %   Neutro Abs 6.6 1.7 - 7.7 K/uL   Lymphocytes Relative 28 %   Lymphs Abs 2.9 0.7 - 4.0 K/uL   Monocytes Relative 6 %   Monocytes Absolute 0.6 0.1 - 1.0 K/uL   Eosinophils Relative 2 %   Eosinophils Absolute 0.2 0.0 - 0.5 K/uL   Basophils Relative 0 %   Basophils Absolute 0.0 0.0 - 0.1 K/uL   Immature Granulocytes 0 %   Abs Immature Granulocytes 0.03 0.00 - 0.07 K/uL     Comment: Performed at [facility], [address] [place], [place] [address]  Comprehensive metabolic panel     Status: Abnormal   Collection Time: 07/15/19  9:27 PM  Result Value Ref Range   Sodium 137 135 - 145 mmol/L   Potassium 3.7 3.5 - 5.1 mmol/L    Comment: HEMOLYSIS AT THIS LEVEL MAY AFFECT RESULT   Chloride 103 98 - 111 mmol/L   CO2 21 (L) 22 - 32 mmol/L   Glucose, Bld 76 70 - 99 mg/dL   BUN 20 6 - 20 mg/dL   Creatinine, Ser 6.290.88 0.44 - 1.00 mg/dL   Calcium 8.8 (L) 8.9 - 10.3 mg/dL   Total Protein 7.1 6.5 - 8.1 g/dL   Albumin 3.8 3.5 - 5.0 g/dL   AST 21 15 - 41 U/L   ALT 15 0 - 44 U/L   Alkaline Phosphatase 38 38 - 126 U/L   Total Bilirubin 0.7 0.3 - 1.2 mg/dL   GFR calc non Af Amer >60 >60 mL/min   GFR calc Af Amer >60 >60 mL/min   Anion gap 13 5 - 15    Comment: Performed at [facility], [address] [place], [place] [address]  Glucose, capillary     Status: None   Collection Time: 07/15/19 10:07 PM  Result Value Ref Range   Glucose-Capillary 83 70 - 99 mg/dL   Comment 1 Document in Chart   Urinalysis, Routine w reflex microscopic     Status: Abnormal   Collection Time: 07/15/19 10:18 PM  Result Value Ref Range   Color, Urine STRAW (A) YELLOW   APPearance CLEAR (A) CLEAR   Specific Gravity, Urine 1.021 1.005 - 1.030   pH 6.0 5.0 - 8.0   Glucose, UA >=500 (A) NEGATIVE mg/dL   Hgb urine dipstick NEGATIVE NEGATIVE   Bilirubin Urine NEGATIVE NEGATIVE   Ketones, ur NEGATIVE NEGATIVE mg/dL   Protein, ur NEGATIVE NEGATIVE mg/dL   Nitrite NEGATIVE NEGATIVE   Leukocytes,Ua NEGATIVE NEGATIVE   WBC, UA 0-5 0 - 5 WBC/hpf   Bacteria, UA NONE SEEN NONE SEEN   Squamous Epithelial / LPF 0-5 0 - 5    Comment: Performed at [facility], [address] [place], [place] [address]   No results found.  Pending Labs Unresulted Labs (From admission, onward)    Start     Ordered   07/16/19 0500  Hemoglobin A1c  Tomorrow morning,   STAT  07/15/19 2334   07/16/19 0500  Lipid panel  Tomorrow morning,   STAT    Comments: Fasting    07/15/19 2334   07/15/19 2328  HIV Antibody (routine testing w rflx)  (HIV Antibody (Routine testing w reflex) panel)  Once,   STAT     07/15/19 2334   07/15/19 2323  Pregnancy, urine  Add-on,   AD     07/15/19 2322   07/15/19 2257  SARS CORONAVIRUS 2 (TAT 6-24 HRS) Nasopharyngeal Nasopharyngeal Swab  (Asymptomatic/Tier 2 Patients Labs)  Once,   STAT    Question Answer Comment  Is this test for diagnosis or screening Screening   Symptomatic for COVID-19 as defined by CDC No   Hospitalized for COVID-19 No   Admitted to ICU for COVID-19 No   Previously tested for COVID-19 Yes   Resident in a congregate (group) care setting No   Employed in healthcare setting No   Pregnant No      07/15/19 2256   07/15/19 2215  Ethanol  Once,   STAT     07/15/19 2215          Vitals/Pain Today's Vitals   07/15/19 2146 07/15/19 2157 07/15/19 2200 07/15/19 2300  BP:   119/83 128/81  Pulse:   72 91  Resp:   (!) 21 18  Temp:  98.5 F (36.9 C)    TempSrc:  Oral    SpO2:   99% 96%  Weight:    76.2 kg  PainSc: 0-No pain       Isolation Precautions No active isolations  Medications Medications   stroke: mapping our early stages of recovery book (has no administration in time range)  0.9 %  sodium chloride infusion (has no administration in time range)  acetaminophen (TYLENOL) tablet 650 mg (has no administration in time range)    Or  acetaminophen (TYLENOL) solution 650 mg (has no administration in time range)    Or  acetaminophen (TYLENOL) suppository 650 mg (has no administration in time range)  senna-docusate (Senokot-S) tablet 1 tablet (has no administration in time range)  enoxaparin (LOVENOX) injection 40 mg (has no administration in time range)  aspirin suppository 300 mg (has no administration in time range)    Or  aspirin EC tablet 325 mg (has no administration in time range)   levETIRAcetam (KEPPRA) tablet 1,000 mg (has no administration in time range)  ondansetron (ZOFRAN) injection 4 mg (has no administration in time range)  dextrose 50 % solution 50 mL (50 mLs Intravenous Given 07/15/19 2130)  iohexol (OMNIPAQUE) 350 MG/ML injection 75 mL (75 mLs Intravenous Contrast Given 07/15/19 2142)  levETIRAcetam (KEPPRA) IVPB 1000 mg/100 mL premix (0 mg Intravenous Stopped 07/15/19 2335)  aspirin chewable tablet 81 mg (81 mg Oral Given 07/15/19 2316)  prochlorperazine (COMPAZINE) injection 10 mg (10 mg Intravenous Given 07/15/19 2319)  acetaminophen (TYLENOL) tablet 650 mg (650 mg Oral Given 07/15/19 2316)    Mobility walks with person assist Low fall risk   Focused Assessments Neuro Assessment Handoff:  Swallow screen pass? Yes    NIH Stroke Scale ( + Modified Stroke Scale Criteria)  Interval: Neuro change Level of Consciousness (1a.)   : Alert, keenly responsive LOC Questions (1b. )   +: Answers both questions correctly LOC Commands (1c. )   + : Performs both tasks correctly Best Gaze (2. )  +: Normal Visual (3. )  +: No visual loss Facial Palsy (4. )    : Normal symmetrical movements  Motor Arm, Left (5a. )   +: Drift Motor Arm, Right (5b. )   +: No drift Motor Leg, Left (6a. )   +: No drift Motor Leg, Right (6b. )   +: No drift Limb Ataxia (7. ): Absent Sensory (8. )   +: Mild-to-moderate sensory loss, patient feels pinprick is less sharp or is dull on the affected side, or there is a loss of superficial pain with pinprick, but patient is aware of being touched Best Language (9. )   +: No aphasia Dysarthria (10. ): Normal Extinction/Inattention (11.)   +: No Abnormality Modified SS Total  +: 2 Complete NIHSS TOTAL: 2 Last date known well: 07/15/19 Last time known well: 1900 Neuro Assessment:   Neuro Checks:   Neuro change (07/15/19 2220)  Last Documented NIHSS Modified Score: 2 (07/15/19 2220) Has TPA been given? No If patient is a Neuro Trauma  and patient is going to OR before floor call report to [facility] nurse: [phone] or [phone]     R Recommendations: See Admitting Provider Note  Report given to:   Additional Notes:

## 2019-07-15 NOTE — ED Notes (Signed)
Neuro MD discussing tPA administration

## 2019-07-15 NOTE — ED Notes (Signed)
Teleneuro provider on screen, 1017 provider asking questions  Patient alert but drowsy

## 2019-07-15 NOTE — ED Notes (Signed)
Code stroke button hitx2

## 2019-07-15 NOTE — ED Notes (Signed)
CT arrival 2138

## 2019-07-15 NOTE — ED Provider Notes (Signed)
[facility] Emergency Department Provider Note  ____________________________________________   First MD Initiated Contact with Patient 07/15/19 2113     (approximate)  I have reviewed the triage vital signs and the nursing notes.   HISTORY  Chief Complaint Left-sided weakness.   HPI Christie Oconnor is a 33 y.o. female otherwise healthy who presents with left-sided weakness and altered mental status.  Per family per EMS patient was last seen normal at 63 PM-ish.  Patient then started having left-sided tingling and left-sided weakness in the leg. Weakness mild, nothing makes it better or worse.  The also noted her to be more confused than normal.  She denies any drug use.  Says she took 3 sips of a margarita but denies drinking anything else.  She denies any fevers, urinary symptoms, vaginal bleeding.            History reviewed. No pertinent past medical history.  There are no active problems to display for this patient.   Past Surgical History:  Procedure Laterality Date  . ANKLE SURGERY Left   . CESAREAN SECTION     x 2  . TUBAL LIGATION      Prior to Admission medications   Not on File    Allergies Patient has no known allergies.  No family history on file.  Social History Social History   Tobacco Use  . Smoking status: Current Every Day Smoker  . Smokeless tobacco: Never Used  Substance Use Topics  . Alcohol use: Yes  . Drug use: Not on file      Review of Systems Constitutional: No fever/chills Eyes: No visual changes. ENT: No sore throat. Cardiovascular: Denies chest pain. Respiratory: Denies shortness of breath. Gastrointestinal: No abdominal pain.  No nausea, no vomiting.  No diarrhea.  No constipation. Genitourinary: Negative for dysuria. Musculoskeletal: Negative for back pain. Skin: Negative for rash. Neurological: + left sided weakness and left sided numbness All other ROS negative  ____________________________________________   PHYSICAL EXAM:  VITAL SIGNS: Blood pressure 119/83, pulse 72, temperature 98.5 F (36.9 C), temperature source Oral, resp. rate (!) 21, SpO2 99 %.   Constitutional: Alert and oriented. Well appearing and in no acute distress. Eyes: Conjunctivae are normal. EOMI. Head: Atraumatic. Nose: No congestion/rhinnorhea. Mouth/Throat: Mucous membranes are moist.   Neck: No stridor. Trachea Midline. FROM Cardiovascular: Normal rate, regular rhythm. Grossly normal heart sounds.  Good peripheral circulation. Respiratory: Normal respiratory effort.  No retractions. Lungs CTAB. Gastrointestinal: Soft and nontender. No distention. No abdominal bruits.  Musculoskeletal: No lower extremity tenderness nor edema.  No joint effusions. Neurologic:  Normal speech and language. Left sided weakness and tingling. And right facial droop.   Skin:  Skin is warm, dry and intact. No rash noted. Psychiatric: Mood and affect are normal. Speech and behavior are normal. GU: Deferred   ____________________________________________   LABS (all labs ordered are listed, but only abnormal results are displayed)  Labs Reviewed  COMPREHENSIVE METABOLIC PANEL - Abnormal; Notable for the following components:      Result Value   CO2 21 (*)    Calcium 8.8 (*)    All other components within normal limits  URINALYSIS, ROUTINE W REFLEX MICROSCOPIC - Abnormal; Notable for the following components:   Color, Urine STRAW (*)    APPearance CLEAR (*)    Glucose, UA >=500 (*)    All other components within normal limits  GLUCOSE, CAPILLARY - Abnormal; Notable for the following components:   Glucose-Capillary 66 (*)  All other components within normal limits  SARS CORONAVIRUS 2 (TAT 6-24 HRS)  PROTIME-INR  APTT  CBC  DIFFERENTIAL  GLUCOSE, CAPILLARY  ETHANOL  I-STAT CREATININE, ED  POC URINE PREG, ED  POC URINE PREG, ED   ____________________________________________    ED ECG REPORT I, Concha Se, the attending physician, personally viewed and interpreted this ECG.  EKG is normal sinus rate of 78, no ST elevation, T wave version in V2, normal intervals ____________________________________________  RADIOLOGY  ED MD interpretation: CT scans are negative.   ____________________________________________   PROCEDURES  Procedure(s) performed (including Critical Care):  Procedures   ____________________________________________   INITIAL IMPRESSION / ASSESSMENT AND PLAN / ED COURSE  Christie Oconnor was evaluated in Emergency Department on 07/15/2019 for the symptoms described in the history of present illness. She was evaluated in the context of the global COVID-19 pandemic, which necessitated consideration that the patient might be at risk for infection with the SARS-CoV-2 virus that causes COVID-19. Institutional protocols and algorithms that pertain to the evaluation of patients at risk for COVID-19 are in a state of rapid change based on information released by regulatory bodies including the CDC and federal and state organizations. These policies and algorithms were followed during the patient's care in the ED.    Patient is a 32 year old who presents with left sided weakness and sensation changes on the left side that started today.  Last known normal was 7 PM so she is within the TPA window.  Will get CT head as well as CT dissection to make sure this is not from a dissection or LVO and discussed with the stroke team.  Will get labs to evaluate for anemia, electrolyte abnormalities, AKI.  Patient does not have any risk factors for stroke but her exam is definitely concerning.  Patient also reports having 2 episodes previously of passing out and the concern for may be these were seizure.  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  Sugar was in the 60s so given amp of dextrose to ensure that this is not secondary to hypoglycemia.  NIHSS  is 5.   CT head and CTA are negative.  Discussed with neurology Dr. Adrienne Mocha who offered TPA but patient declined   recommends that patient be admitted for MRI, echo, EEG, gets a baby aspirin now and load of 1000 mg of Keppra.  Patient now having a headache so maybe this is a complex migraine.  Will give some Compazine.  Discussed with the hospital team for admission.   ____________________________________________   FINAL CLINICAL IMPRESSION(S) / ED DIAGNOSES   Final diagnoses:  Left-sided weakness      MEDICATIONS GIVEN DURING THIS VISIT:  Medications  levETIRAcetam (KEPPRA) IVPB 1000 mg/100 mL premix (has no administration in time range)  aspirin chewable tablet 81 mg (has no administration in time range)  prochlorperazine (COMPAZINE) injection 10 mg (has no administration in time range)  acetaminophen (TYLENOL) tablet 650 mg (has no administration in time range)  dextrose 50 % solution 50 mL (50 mLs Intravenous Given 07/15/19 2130)  iohexol (OMNIPAQUE) 350 MG/ML injection 75 mL (75 mLs Intravenous Contrast Given 07/15/19 2142)     ED Discharge Orders    None       Note:  This document was prepared using Dragon voice recognition software and may include unintentional dictation errors.   Concha Se, MD 07/15/19 [phone]

## 2019-07-15 NOTE — ED Notes (Signed)
Teleneuro team on screen at 2200

## 2019-07-15 NOTE — ED Triage Notes (Signed)
Pt to ED from home via [facility] c/o AMS that started at [employer].  Per EMS patient was sitting on couch speaking with mom and suddenly started acting differently.  Upon arrival patient slow to respond verbally, decreased attention.  EMS vitals 120/90 BP, 70 HR, 98% RA, 74 CBG, 98.7 temp.  Upon arrival patient assessed by EDP and found to have weakness in left arm and leg, states tingling and decreased left sided sensation.  Pt also states had several sips of alcohol, possibly margarita.

## 2019-07-15 NOTE — ED Notes (Signed)
Code stroke cart button hit x1

## 2019-07-15 NOTE — ED Notes (Signed)
Boyfriend at bedside

## 2019-07-15 NOTE — Progress Notes (Signed)
Ch received pg for code STROKE. Ch checked in with nurse secretary who shared that pt will be in room 8 after CT scan. Ch checked in with pt that was able to respond. Pt requested that her significant other be contacted for an update. F/U with care team and pt once screened.   07/15/19 2200  Clinical Encounter Type  Visited With Patient;Health care provider  Visit Type Critical Care;ED  Referral From Nurse  Consult/Referral To Chaplain  Stress Factors  Patient Stress Factors Health changes
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
## 2019-07-15 NOTE — ED Notes (Addendum)
According to patient, she had seizure in 2009 where she blacked out and had similar symptoms  Boyfriend reports patient was shaking esp L arm when EMS arrived, lasted during ambulance ride; lasted maybe 10 mins?

## 2019-07-16 ENCOUNTER — Inpatient Hospital Stay: Payer: Medicaid Other

## 2019-07-16 ENCOUNTER — Inpatient Hospital Stay
Admit: 2019-07-16 | Discharge: 2019-07-16 | Disposition: A | Discharge status: Home / Self Care | Payer: Medicaid Other | Attending: Family Medicine | Admitting: Family Medicine

## 2019-07-16 ENCOUNTER — Other Ambulatory Visit: Payer: Self-pay

## 2019-07-16 ENCOUNTER — Encounter: Payer: Self-pay | Admitting: Emergency Medicine

## 2019-07-16 DIAGNOSIS — R531 Weakness: Secondary | ICD-10-CM

## 2019-07-16 DIAGNOSIS — R569 Unspecified convulsions: Secondary | ICD-10-CM

## 2019-07-16 LAB — ECHOCARDIOGRAM COMPLETE
Height: 69 in
Weight: 2811.2 oz

## 2019-07-16 LAB — ETHANOL: Alcohol, Ethyl (B): <10 mg/dL (ref <10)

## 2019-07-16 LAB — URINE DRUG SCREEN, QUALITATIVE (ARMC ONLY)
Amphetamines, Ur Screen: NOT DETECTED
Barbiturates, Ur Screen: NOT DETECTED
Benzodiazepine, Ur Scrn: NOT DETECTED
Cannabinoid 50 Ng, Ur ~~LOC~~: POSITIVE — AB
Cocaine Metabolite,Ur ~~LOC~~: NOT DETECTED
MDMA (Ecstasy)Ur Screen: NOT DETECTED
Methadone Scn, Ur: NOT DETECTED
Opiate, Ur Screen: NOT DETECTED
Phencyclidine (PCP) Ur S: NOT DETECTED
Tricyclic, Ur Screen: NOT DETECTED

## 2019-07-16 LAB — LIPID PANEL
Cholesterol: 168 mg/dL (ref 0–200)
HDL: 63 mg/dL (ref >40)
LDL Cholesterol: 95 mg/dL (ref 0–99)
Total CHOL/HDL Ratio: 2.7 RATIO
Triglycerides: 50 mg/dL (ref <150)
VLDL: 10 mg/dL (ref 0–40)

## 2019-07-16 LAB — HIV ANTIBODY (ROUTINE TESTING W REFLEX): HIV Screen 4th Generation wRfx: NONREACTIVE

## 2019-07-16 LAB — HEMOGLOBIN A1C
Hgb A1c MFr Bld: 4.8 % (ref 4.8–5.6)
Mean Plasma Glucose: 91.06 mg/dL

## 2019-07-16 LAB — SARS CORONAVIRUS 2 (TAT 6-24 HRS): SARS Coronavirus 2: NEGATIVE

## 2019-07-16 LAB — PREGNANCY, URINE: Preg Test, Ur: NEGATIVE

## 2019-07-16 MED ORDER — ATORVASTATIN CALCIUM 20 MG PO TABS
10.0000 mg | ORAL_TABLET | Freq: Every day | ORAL | Status: DC
  Administered 2019-07-16 – 2019-07-18 (×3): 10 mg via ORAL
  Filled 2019-07-16 (×3): qty 1

## 2019-07-16 NOTE — Progress Notes (Signed)
*  PRELIMINARY RESULTS* Echocardiogram 2D Echocardiogram has been performed.  Sherrie Sport 07/16/2019, 9:40 AM

## 2019-07-16 NOTE — Procedures (Signed)
ELECTROENCEPHALOGRAM REPORT   Patient: Christie Oconnor       Room #: 130A-AA EEG No. ID: [id number] Age: 33 y.o.        Sex: female Referring Physician: [facility] Report Date:  07/16/2019        Interpreting Physician: Alexis Goodell  History: [name] is an 33 y.o. female with seizure like activity  Medications:  ASA, Keppra, Lipitor  Conditions of Recording:  This is a 21 channel routine scalp EEG performed with bipolar and monopolar montages arranged in accordance to the international 10/20 system of electrode placement. One channel was dedicated to EKG recording.  The patient is in the awake, drowsy and asleep states.  Description:  The waking background activity consists of a low voltage, symmetrical, fairly well organized, 10 Hz alpha activity, seen from the parieto-occipital and posterior temporal regions.  Low voltage fast activity, poorly organized, is seen anteriorly and is at times superimposed on more posterior regions.  A mixture of theta and alpha rhythms are seen from the central and temporal regions. The patient drowses with slowing to irregular, low voltage theta and beta activity.   The patient goes in to a light sleep with symmetrical sleep spindles, vertex central sharp transients and irregular slow activity. No epileptiform activity is noted.  Hyperventilation was not performed.  Intermittent photic stimulation was performed but failed to illicit any change in the tracing.    IMPRESSION: Normal electroencephalogram, awake, asleep and with activation procedures. There are no focal lateralizing or epileptiform features.   Alexis Goodell, MD Neurology [phone] 07/16/2019, 2:28 PM

## 2019-07-16 NOTE — H&P (Addendum)
[facility] - [place] at [facility]   PATIENT NAME: Christie Oconnor    MR#:  [id number]  DATE OF BIRTH:  09-Dec-1985  DATE OF ADMISSION:  07/15/2019  PRIMARY CARE PHYSICIAN: Patient, No Pcp Per   REQUESTING/REFERRING PHYSICIAN: Artis Delay, MD  CHIEF COMPLAINT:   Chief Complaint  Patient presents with  . Altered Mental Status  Left-sided weakness  HISTORY OF PRESENT ILLNESS:  Christie Oconnor  is a 33 y.o. African-American female with a known history of ongoing tobacco abuse as well as marijuana abuse, presented to [facility] left-sided weakness with associated left facial droop and slurred speech as well as headache with mild nausea without vomiting started about 7:30 PM she had associated mild dizziness and blurred vision.  No reported chest pain or palpitations.  The patient was fairly somnolent and therefore poor historian and most of the history was given by her boyfriend was with her.  By the time she got to the ambulance she was noted to have shaking of the left arm and suspected seizure.  No reported cough or wheezing or shortness of breath or fever or chills.  She did not have any tongue bites or urinary or stool incontinence.  No reported tinnitus or vertigo.  No reported paresthesias.  Upon presentation to the emergency room blood pressure was 137/93 with otherwise normal vital signs.  Labs revealed normal blood glucose and otherwise normal labs.  She unremarkable and the neck CTA.  EKG showed no sinus rhythm with a rate of 78.  She was seen by telemetry neurology and was thought to not be a candidate for TPA.  She was given a loading dose of 1 g of IV Keppra and the recommendation was for 1000 mg p.o. twice daily.  She was also given 10 mg IV Compazine, an ampule of D50, a baby aspirin and 60 mg of p.o. Tylenol.  She will be admitted to a medical monitored bed for further evaluation and management.   PAST MEDICAL HISTORY:  Ongoing tobacco  abuse Marijuana abuse  PAST SURGICAL HISTORY:   Past Surgical History:  Procedure Laterality Date  . ANKLE SURGERY Left   . CESAREAN SECTION     x 2  . TUBAL LIGATION      SOCIAL HISTORY:   Social History   Tobacco Use  . Smoking status: Current Every Day Smoker  . Smokeless tobacco: Never Used  Substance Use Topics  . Alcohol use: Yes    FAMILY HISTORY:  Positive for CVA in her mother and seizure in her uncle.  DRUG ALLERGIES:  No Known Allergies  REVIEW OF SYSTEMS:   ROS As per history of present illness. All pertinent systems were reviewed above. Constitutional,  HEENT, cardiovascular, respiratory, GI, GU, musculoskeletal, neuro, psychiatric, endocrine,  integumentary and hematologic systems were reviewed and are otherwise  negative/unremarkable except for positive findings mentioned above in the HPI.   MEDICATIONS AT HOME:   Prior to Admission medications   Not on File      VITAL SIGNS:  Blood pressure 109/69, pulse 82, temperature 98.5 F (36.9 C), temperature source Oral, resp. rate 20, weight 76.2 kg, SpO2 98 %.  PHYSICAL EXAMINATION:  Physical Exam  GENERAL:  33 y.o.-year-old African-American female patient lying in the bed with no acute distress.  EYES: Pupils equal, round, reactive to light and accommodation. No scleral icterus. Extraocular muscles intact.  HEENT: Head atraumatic, normocephalic. Oropharynx and nasopharynx clear.  NECK:  Supple, no jugular venous distention.  No thyroid enlargement, no tenderness.  LUNGS: Normal breath sounds bilaterally, no wheezing, rales,rhonchi or crepitation. No use of accessory muscles of respiration.  CARDIOVASCULAR: Regular rate and rhythm, S1, S2 normal. No murmurs, rubs, or gallops.  ABDOMEN: Soft, nondistended, nontender. Bowel sounds present. No organomegaly or mass.  EXTREMITIES: No pedal edema, cyanosis, or clubbing.  NEUROLOGIC: Cranial nerves II through XII are intact. Muscle strength was 4/5 in  left upper and 3/5 left lower extremity compared to 5/5 in the right upper and lower extremities.  She had normal sensory exam to light touch.  Gait was not tested. PSYCHIATRIC: The patient is alert and oriented x 3.  Normal affect and good eye contact. SKIN: No obvious rash, lesion, or ulcer.   LABORATORY PANEL:   CBC Recent Labs  Lab 07/15/19 2127  WBC 10.3  HGB 13.2  HCT 38.9  PLT 257   ------------------------------------------------------------------------------------------------------------------  Chemistries  Recent Labs  Lab 07/15/19 2127  NA 137  K 3.7  CL 103  CO2 21*  GLUCOSE 76  BUN 20  CREATININE 0.88  CALCIUM 8.8*  AST 21  ALT 15  ALKPHOS 38  BILITOT 0.7   ------------------------------------------------------------------------------------------------------------------  Cardiac Enzymes No results for input(s): TROPONINI in the last 168 hours. ------------------------------------------------------------------------------------------------------------------  RADIOLOGY:  No results found.    IMPRESSION AND PLAN:   1.  Left-sided hemiparesis with dysarthria concerning for possible right MCA territory infarction.The patient will be admitted to a medically monitored bed.  We will follow neuro checks q.4 hours for 24 hours.  The patient will be placed on aspirin.  Will obtain a brain MRI without contrast as well as bilateral carotid Doppler and 2D echo with bubble study .  A neurology consultation (when available) as well as physical/occupation/speech therapy consults will be obtained in a.m..  The patient will be placed on statin therapy and fasting lipids will be checked.  I notified Dr. Doy Mince about the patient.  2.  Seizure of new onset.  She will be placed on p.o. Keppra as mentioned above.  EEG will be obtained.  Neurology consult to be obtained.  3.  Ongoing tobacco abuse.  I counseled her for smoking cessation and she will receive further counseling  here.  4.  Marijuana abuse.  She was counseled for cessation.  5.  DVT prophylaxis.  Subtenons Lovenox.   All the records are reviewed and case discussed with ED provider. The plan of care was discussed in details with the patient (and family). I answered all questions. The patient agreed to proceed with the above mentioned plan. Further management will depend upon hospital course.   CODE STATUS: Full code  TOTAL TIME TAKING CARE OF THIS PATIENT: 50 minutes.    Christel Mormon M.D on 07/16/2019 at 12:14 AM  Pager - [phone]  After 6pm go to [email or web address] - Proofreader  [facility]  Office  [phone]  CC: Primary care physician; Patient, No Pcp Per   Note: This dictation was prepared with Dragon dictation along with smaller phrase technology. Any transcriptional errors that result from this process are unintentional.

## 2019-07-16 NOTE — Evaluation (Signed)
Occupational Therapy Evaluation Patient Details Name: Christie Oconnor MRN: [id number] DOB: [birth year] Today's Date: 07/16/2019    History of Present Illness Pt admitted for possible CVA, MRI (-) for acute infarct, however showed "Scattered periventricular deep white matter hyperintensities bilaterally". History of tobacco and marijuana use. Pt with complaints of L side weakness and AMS along with jerking episode.   Clinical Impression   Christie Oconnor was seen for OT evaluation this date. Prior to hospital admission, pt was independent in all aspects of ADL/IADL management. Pt endorses working and caring for her two young daughters. Pt lives with her boyfriend, mother, uncle, and children in a one-level home with three steps to enter. Currently pt demonstrates mild impairments in LUE/LLE strength, coordination, and sensation. Pt is R hand dominant and requires supervision to CGA assist to support her standing balance during completion of grooming ADLs at the sink this date. She is able to use a RW to ambulate to her room commode where she completes toileting and peri-care with supervision for safety. Pt received in clothing from home which she endorses she put on with increased time/effort prior to OT session. Pt is eager to return to her PLOF with increased independence and functional use of her left arm and leg. Pt educated on safe positioning of LUE in order to promote functional return, improve safety, and promote skin integrity on this date. No cognitive or visual deficits noted with assessment on this date. Pt would benefit from skilled OT to address noted impairments and functional limitations (see below for any additional details) in order to maximize safety and independence while minimizing falls risk and caregiver burden.  Upon hospital discharge, recommend pt discharge recommend HHOT to maximize pt safety and return to PLOF during meaningful occupations of daily life.     Follow Up  Recommendations  Home health OT    Equipment Recommendations  Tub/shower seat    Recommendations for Other Services       Precautions / Restrictions Precautions Precautions: Fall Restrictions Weight Bearing Restrictions: No      Mobility Bed Mobility Overal bed mobility: Needs Assistance Bed Mobility: Supine to Sit;Sit to Supine     Supine to sit: Supervision Sit to supine: Min guard   General bed mobility comments: Pt comes to sitting at EOB with increased time/effort. When returning to supine from seated position, pt noed to hold onto leg of her pants to pull her LLE into bed rather than lift it independently  Transfers Overall transfer level: Needs assistance Equipment used: Rolling walker (2 wheeled) Transfers: Sit to/from Stand Sit to Stand: Min guard;Supervision         General transfer comment: Pt completes STS with increased time/effort this date, but otherwise no physical assist needed from this therapist. She reports mild to moderate dizziness with transfers this date, but remains stable once standing. CGA provided occasionally during ambulation for pt safety. Pt noted to lean onto her LLE this date and requires prompting to more evenly distribute her weight across her BOS during functional activities.    Balance Overall balance assessment: Needs assistance Sitting-balance support: Feet supported Sitting balance-Leahy Scale: Fair Sitting balance - Comments: Pt steady in static sitting and can withstand a challenge during MMT, however pt noted to be have mild LOB during dynamic sitting tasks involving weight shifting toward the R.   Standing balance support: During functional activity;No upper extremity supported Standing balance-Leahy Scale: Fair Standing balance comment: Pt steady static standing at sink to complete oral care.  Requires occasional CGA with ambulation, but otherwise maintains balance well without increased sway. Pt ambulated using a RW and is  able to navigate her room with min VC's from this therapist for safety.                           ADL either performed or assessed with clinical judgement   ADL Overall ADL's : Needs assistance/impaired Eating/Feeding: Set up;Sitting;Supervision/ safety   Grooming: Supervision/safety;Set up;Standing;Oral care Grooming Details (indicate cue type and reason): Pt completes oral care while standing at sink this date. She is able to maintain standing balance with supervision to CGA for safety. She demonstrates decreased LUE FMC, but is able to complete oral care without physical assist from this therapist using BUE to apply toothpaste. Her caregiver at bedside encourages pt to attemmpt to brush with her L (nondominant) use, which she has significant difficulty doing this date.                 Toilet Transfer: RW;Min guard;Set up;Supervision/safety;Regular Teacher, adult educationToilet Toilet Transfer Details (indicate cue type and reason): Pt ambulates to room commode using a RW for stability this date. She is able to lower herself onto low commode using handrail for stability. Toileting- ArchitectClothing Manipulation and Hygiene: Min guard;Sit to/from stand;Sitting/lateral lean;Set up;Supervision/safety Toileting - Clothing Manipulation Details (indicate cue type and reason): Pt completes peri-care independently and completes clothing mgt with SBA for mgt of telemonitor this date.     Functional mobility during ADLs: Min guard;Supervision/safety;Rolling walker       Vision Baseline Vision/History: No visual deficits Patient Visual Report: (Pt reports 2-3 month hx of increased difficulty with visual acuity. She states this impacts her work, and on bad days she will use OTC readers. Reports she has not seen an eye doctor in ~8 years.)       Perception     Praxis      Pertinent Vitals/Pain Pain Assessment: No/denies pain Pain Intervention(s): Monitored during session     Hand Dominance Right    Extremity/Trunk Assessment Upper Extremity Assessment Upper Extremity Assessment: LUE deficits/detail LUE Deficits / Details: Decreased shoulder flexion/abduction this date 3+/5; Elbow flexion/extension and grip 4/5. Pt noted to have decreased FMC with thumb to finger opposition and finger to nose. Pt has difficulty bilaterally with RAM. LUE Sensation: decreased light touch(At all fingers and dorsum of hand) LUE Coordination: decreased fine motor   Lower Extremity Assessment Lower Extremity Assessment: LLE deficits/detail;Defer to PT evaluation LLE Sensation: decreased light touch;decreased proprioception LLE Coordination: decreased gross motor       Communication Communication Communication: No difficulties   Cognition Arousal/Alertness: Awake/alert Behavior During Therapy: WFL for tasks assessed/performed;Flat affect Overall Cognitive Status: Within Functional Limits for tasks assessed                                     General Comments       Exercises Other Exercises Other Exercises: Pt educated in falls prevention strategies, safe use of AE for functional mobility, and considerations for LUE positioning and involvement in functional tasks to promote safety, skin integrity, and maximize functional use. Other Exercises: Pt assisted with grooming tasks while standing at sink as well as toilet transfer to room comoode this date.   Shoulder Instructions      Home Living Family/patient expects to be discharged to:: Private residence Living Arrangements: Other relatives;Children;Spouse/significant other;Parent(Boyfriend,  mom, uncle, and two children) Available Help at Discharge: Family;Available 24 hours/day Type of Home: House Home Access: Stairs to enter [employer] of Steps: 3 Entrance Stairs-Rails: None Home Layout: One level     Bathroom Shower/Tub: Tub/shower unit;Door   [employer] Toilet: Standard     Home Equipment: Crutches           Prior Functioning/Environment Level of Independence: Independent        Comments: was indep with all mobility, working job at [employer], able to sit at job.  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        OT Problem List: Decreased strength;Decreased coordination;Decreased range of motion;Decreased activity tolerance;Decreased safety awareness;Impaired UE functional use;Impaired balance (sitting and/or standing);Decreased knowledge of precautions;Decreased knowledge of use of DME or AE;Impaired sensation      OT Treatment/Interventions: Self-care/ADL training;Balance training;Therapeutic exercise;Therapeutic activities;DME and/or AE instruction;Patient/family education;Neuromuscular education;Modalities;Manual therapy;Visual/perceptual remediation/compensation    OT Goals(Current goals can be found in the care plan section) Acute Rehab OT Goals Patient Stated Goal: 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 OT Goal Formulation: With patient Time For Goal Achievement: 07/30/19 Potential to Achieve Goals: Good ADL Goals Pt Will Perform Grooming: standing;with modified independence(With LRAD PRN for improved safety and functional independence.) Pt Will Perform Upper Body Dressing: sitting;standing;with adaptive equipment;with modified independence(With LRAD PRN for improved safety and functional independence.) Pt Will Perform Lower Body Dressing: sit to/from stand;with adaptive equipment;with modified independence(With LRAD PRN for improved safety and functional independence.)  OT Frequency: Min 2X/week   Barriers to D/C:            Co-evaluation              AM-PAC OT "6 Clicks" Daily Activity     Outcome Measure Help from another person eating meals?: A Little Help from another person taking care of personal grooming?: A Little Help from another person toileting, which includes using toliet, bedpan, or urinal?: A Little Help from another person bathing (including washing,  rinsing, drying)?: A Little Help from another person to put on and taking off regular upper body clothing?: A Little Help from another person to put on and taking off regular lower body clothing?: A Little 6 Click Score: 18   End of Session Equipment Utilized During Treatment: Gait belt;Rolling walker Nurse Communication: Other (comment)(Pt used room commode with hat in place.)  Activity Tolerance: Patient tolerated treatment well Patient left: in bed;with call bell/phone within reach;with bed alarm set;with family/visitor present  OT Visit Diagnosis: Other abnormalities of gait and mobility (R26.89);Hemiplegia and hemiparesis Hemiplegia - Right/Left: Left Hemiplegia - dominant/non-dominant: Non-Dominant Hemiplegia - caused by: Unspecified                Time: 1321-1400 OT Time Calculation (min): 39 min Charges:  OT General Charges $OT Visit: 1 Visit OT Evaluation $OT Eval Low Complexity: 1 Low OT Treatments $Self Care/Home Management : 23-37 mins  Shara Blazing, M.S., OTR/L Ascom: [phone] 07/16/19, 3:09 PM

## 2019-07-16 NOTE — Progress Notes (Signed)
[facility] - [place] [facility]   PATIENT NAME: Christie Oconnor    MR#:  [id number]  DATE OF BIRTH:  03-14-1986  SUBJECTIVE:   Patient states she is feeling better today.  She is answering questions appropriately.  She endorses some continued left leg weakness.  She states she has intermittent left leg weakness at baseline that is usually associated with complete numbness of her left side.  No speech changes or vision changes.  REVIEW OF SYSTEMS:  Review of Systems  Constitutional: Negative for chills and fever.  HENT: Negative for congestion and sore throat.   Eyes: Negative for blurred vision and double vision.  Respiratory: Negative for cough and shortness of breath.   Cardiovascular: Negative for chest pain and palpitations.  Gastrointestinal: Negative for nausea and vomiting.  Genitourinary: Negative for dysuria and urgency.  Musculoskeletal: Negative for back pain and neck pain.  Neurological: Positive for sensory change and focal weakness. Negative for dizziness, speech change and headaches.  Psychiatric/Behavioral: Negative for depression. The patient is not nervous/anxious.     DRUG ALLERGIES:  No Known Allergies VITALS:  Blood pressure 109/76, pulse (!) 55, temperature 98.3 F (36.8 C), temperature source Oral, resp. rate 19, height 5\' 9"  (1.753 m), weight 79.7 kg, SpO2 100 %. PHYSICAL EXAMINATION:  Physical Exam  GENERAL:  Laying in the bed with no acute distress.  HEENT: Head atraumatic, normocephalic. Pupils equal, round, reactive to light and accommodation. No scleral icterus. Extraocular muscles intact. Oropharynx and nasopharynx clear.  NECK:  Supple, no jugular venous distention. No thyroid enlargement. LUNGS: Lungs are clear to auscultation bilaterally. No wheezes, crackles, rhonchi. No use of accessory muscles of respiration.  CARDIOVASCULAR: S1, S2 normal. No murmurs, rubs, or gallops.  ABDOMEN: Soft, nontender, nondistended. Bowel  sounds present.  EXTREMITIES: No pedal edema, cyanosis, or clubbing.  NEUROLOGIC: CN 2-12 intact, no focal deficits. 5/5 muscle strength in the RUE, RLE, LUE, 4-5/5 muscle strength in the left leg. Sensation intact to light touch throughout. Gait not checked.  PSYCHIATRIC: The patient is alert and oriented x 3.  SKIN: No obvious rash, lesion, or ulcer.  LABORATORY PANEL:  Female CBC Recent Labs  Lab 07/15/19 2127  WBC 10.3  HGB 13.2  HCT 38.9  PLT 257   ------------------------------------------------------------------------------------------------------------------ Chemistries  Recent Labs  Lab 07/15/19 2127  NA 137  K 3.7  CL 103  CO2 21*  GLUCOSE 76  BUN 20  CREATININE 0.88  CALCIUM 8.8*  AST 21  ALT 15  ALKPHOS 38  BILITOT 0.7   RADIOLOGY:  Ct Code Stroke Cta Head W/wo Contrast  Result Date: 07/15/2019 CLINICAL DATA:  Initial evaluation for acute left-sided weakness, numbness. EXAM: CT ANGIOGRAPHY HEAD AND NECK TECHNIQUE: Multidetector CT imaging of the head and neck was performed using the standard protocol during bolus administration of intravenous contrast. Multiplanar CT image reconstructions and MIPs were obtained to evaluate the vascular anatomy. Carotid stenosis measurements (when applicable) are obtained utilizing NASCET criteria, using the distal internal carotid diameter as the denominator. CONTRAST:  [id number] [id number] [id number] [id number] [id number] [id number] [id number] [id number] Brownie045conaE IOHEXOL 350 MG/ML SOLN COMPARISON:  None available. FINDINGS: CT HEAD FINDINGS Brain: Cerebral volume within normal limits for patient age. No evidence for acute intracranial hemorrhage. No findings to suggest acute large vessel territory infarct. No mass lesion, midline shift, or mass effect. Ventricles are normal in size without evidence for hydrocephalus. No extra-axial fluid collection identified. Vascular: No hyperdense vessel identified. Skull: Scalp soft tissues demonstrate no acute abnormality. Calvarium intact. Sinuses/Orbits: Globes  and orbital  soft tissues within normal limits. Visualized paranasal sinuses are clear. No mastoid effusion. CTA NECK FINDINGS Aortic arch: Visualized aortic arch of normal caliber with normal 3 vessel morphology. No hemodynamically significant stenosis or other abnormality about the origin of the great vessels. Visualized subclavian arteries widely patent. Right carotid system: Right common and internal carotid arteries widely patent without stenosis, dissection, or occlusion. Minimal atheromatous plaque about the right bifurcation without stenosis. Left carotid system: Left common and internal carotid arteries widely patent without stenosis, dissection or occlusion. No significant atheromatous narrowing about the left bifurcation. Vertebral arteries: Both vertebral arteries arise from the subclavian arteries. Vertebral arteries widely patent within the neck without stenosis, dissection or occlusion. Skeleton: No acute osseous abnormality. No discrete lytic or blastic osseous lesions. Other neck: No other acute soft tissue abnormality within the neck. Upper chest: Visualized upper chest demonstrates no acute finding. Review of the MIP images confirms the above findings CTA HEAD FINDINGS Anterior circulation: Both internal carotid arteries widely patent to the termini without stenosis. A1 segments, anterior communicating artery common anterior cerebral arteries widely patent. No M1 stenosis or occlusion. Normal MCA bifurcations. Distal MCA branches well perfused and symmetric. Posterior circulation: Both vertebral arteries widely patent to the vertebrobasilar junction without stenosis. Both picas patent proximally. Basilar widely patent to its distal aspect. Superior cerebral arteries patent bilaterally. Both PCAs primarily supplied via the basilar and are well perfused to their distal aspects. Venous sinuses: Patent. Anatomic variants: None significant. Review of the MIP images confirms the above findings IMPRESSION: CT HEAD  IMPRESSION: 1. Normal head CT.  No acute intracranial abnormality identified. 2. Aspects equals 10/10. CTA HEAD AND NECK IMPRESSION: Normal CTA of the head and neck. No large vessel occlusion, hemodynamically significant stenosis, or other acute abnormality. Critical Value/emergent results were called by telephone at the time of interpretation on 07/15/2019 at 10:26 pm to [facility] , who verbally acknowledged these results. Electronically Signed   By: Rise Mu M.D.   On: 07/15/2019 22:52   Ct Code Stroke Cta Neck W/wo Contrast  Result Date: 07/15/2019 CLINICAL DATA:  Initial evaluation for acute left-sided weakness, numbness. EXAM: CT ANGIOGRAPHY HEAD AND NECK TECHNIQUE: Multidetector CT imaging of the head and neck was performed using the standard protocol during bolus administration of intravenous contrast. Multiplanar CT image reconstructions and MIPs were obtained to evaluate the vascular anatomy. Carotid stenosis measurements (when applicable) are obtained utilizing NASCET criteria, using the distal internal carotid diameter as the denominator. CONTRAST:  74mL OMNIPAQUE IOHEXOL 350 MG/ML SOLN COMPARISON:  None available. FINDINGS: CT HEAD FINDINGS Brain: Cerebral volume within normal limits for patient age. No evidence for acute intracranial hemorrhage. No findings to suggest acute large vessel territory infarct. No mass lesion, midline shift, or mass effect. Ventricles are normal in size without evidence for hydrocephalus. No extra-axial fluid collection identified. Vascular: No hyperdense vessel identified. Skull: Scalp soft tissues demonstrate no acute abnormality. Calvarium intact. Sinuses/Orbits: Globes and orbital soft tissues within normal limits. Visualized paranasal sinuses are clear. No mastoid effusion. CTA NECK FINDINGS Aortic arch: Visualized aortic arch of normal caliber with normal 3 vessel morphology. No hemodynamically significant stenosis or other abnormality about  the origin of the great vessels. Visualized subclavian arteries widely patent. Right carotid system: Right common and internal carotid arteries widely patent without stenosis, dissection, or occlusion. Minimal atheromatous plaque about the right bifurcation without stenosis. Left carotid system: Left common and internal carotid arteries widely patent without stenosis, dissection or occlusion.  No significant atheromatous narrowing about the left bifurcation. Vertebral arteries: Both vertebral arteries arise from the subclavian arteries. Vertebral arteries widely patent within the neck without stenosis, dissection or occlusion. Skeleton: No acute osseous abnormality. No discrete lytic or blastic osseous lesions. Other neck: No other acute soft tissue abnormality within the neck. Upper chest: Visualized upper chest demonstrates no acute finding. Review of the MIP images confirms the above findings CTA HEAD FINDINGS Anterior circulation: Both internal carotid arteries widely patent to the termini without stenosis. A1 segments, anterior communicating artery common anterior cerebral arteries widely patent. No M1 stenosis or occlusion. Normal MCA bifurcations. Distal MCA branches well perfused and symmetric. Posterior circulation: Both vertebral arteries widely patent to the vertebrobasilar junction without stenosis. Both picas patent proximally. Basilar widely patent to its distal aspect. Superior cerebral arteries patent bilaterally. Both PCAs primarily supplied via the basilar and are well perfused to their distal aspects. Venous sinuses: Patent. Anatomic variants: None significant. Review of the MIP images confirms the above findings IMPRESSION: CT HEAD IMPRESSION: 1. Normal head CT.  No acute intracranial abnormality identified. 2. Aspects equals 10/10. CTA HEAD AND NECK IMPRESSION: Normal CTA of the head and neck. No large vessel occlusion, hemodynamically significant stenosis, or other acute abnormality. Critical  Value/emergent results were called by telephone at the time of interpretation on 07/15/2019 at 10:26 pm to [facility] , who verbally acknowledged these results. Electronically Signed   By: Jeannine Boga M.D.   On: 07/15/2019 22:52   Mr Brain Wo Contrast  Result Date: 07/16/2019 CLINICAL DATA:  Hemiplegia. Left facial droop and slurred speech. Headache. EXAM: MRI HEAD WITHOUT CONTRAST TECHNIQUE: Multiplanar, multiecho pulse sequences of the brain and surrounding structures were obtained without intravenous contrast. COMPARISON:  CT a head and neck 07/15/2019 FINDINGS: Brain: Negative for acute infarct. Ventricle size and cerebral volume normal. Scattered white matter lesions. Combination of periventricular and deep white matter hyperintensities. Hyperintensity also in the right posterior external capsule. Brainstem and cerebellum intact. Negative for hemorrhage or mass. Vascular: Normal arterial flow voids Skull and upper cervical spine: Negative Sinuses/Orbits: Mild mucosal edema paranasal sinuses.  Normal orbit Other: None IMPRESSION: Negative for acute infarct Scattered periventricular deep white matter hyperintensities bilaterally, suspicious for multiple sclerosis. Other possibilities include chronic ischemia, vasculitis, complex migraine headaches Electronically Signed   By: Franchot Gallo M.D.   On: 07/16/2019 12:12   ASSESSMENT AND PLAN:   Left-sided hemiparesis and slurred speech- differentials include CVA, seizure with Todd's paralysis. -MRI brain pending -Neurology consult -Continue aspirin and plavix -ECHO and carotid dopplers -EEG ordered -UDS pending -PT/OT/SLP  Seizure-like activity- no new seizure-like episodes this admission. -Continue Keppra -EEG pending -Neurology following  Tobacco use -Tobacco cessation counseling performed this admission  Marijuana use -Substance cessation counseling performed  All the records are reviewed and case discussed with  Care Management/Social Worker. Management plans discussed with the patient, family and they are in agreement.  CODE STATUS: Full Code  TOTAL TIME TAKING CARE OF THIS PATIENT: 35 minutes.   More than 50% of the time was spent in counseling/coordination of care: YES  POSSIBLE D/C IN 1-2 DAYS, DEPENDING ON CLINICAL CONDITION.   Berna Spare Berkeley Veldman M.D on 07/16/2019 at 1:52 PM  Between 7am to 6pm - Pager [phone]  After 6pm go to [email or web address] - Proofreader  [facility]  Hospital[facility]  Office  [phone]  CC: Primary care physician; Patient, No Pcp Per  Note: This dictation was prepared with Dragon dictation along with  smaller phrase technology. Any transcriptional errors that result from this process are unintentional.

## 2019-07-16 NOTE — Progress Notes (Signed)
Per Dr Sidney Ace, do neuro checks every 4 hours x24 hours, discontinue NIH and Q2 VS at this time

## 2019-07-16 NOTE — TOC Progression Note (Addendum)
Transition of Care [facility]) - Progression Note    Patient Details  Name: Christie Oconnor MRN: [id number] Date of Birth: May 03, 1986  Transition [facility] Care [facility] CM/SW Contact  Norina Buzzard, RN Phone Number: 07/16/2019, 4:31 PM  Clinical Narrative: 33 yo admitted with Left-sided hemiparesis and slurred speech- differentials include CVA, seizure with Todd's paralysis. MRI brain is pending. Met with pt and boyfriend to discuss the D/C plan. Pt lives with her mother, kids and boyfriend. She reports a good family support. Pt denies any issues filling her prescriptions. She doesn't have a PCP. Encouraged pt to contact Medicaid for assistance finding a PCP. Discussed with pt the importance of having a PCP and she needs to f/u with a physician after she is D/C from the hospital. She verbalized understanding. She reports that she doesn't have any DME. She is using a walker at present time.  PT is recommending CIR. Pt agrees with CIR. CIR to f/u to see if pt meets criteria. Will continue to f/u to assist with the D/C plan.  Expected Discharge Plan: [place]    Expected Discharge Plan and Services Expected Discharge Plan: [place]   Discharge Planning Services: CM Consult   Living arrangements for the past 2 months: Single Family Home                                       Social Determinants of Health (SDOH) Interventions    Readmission Risk Interventions No flowsheet data found.

## 2019-07-16 NOTE — Evaluation (Signed)
Physical Therapy Evaluation Patient Details Name: Christie Oconnor MRN: [id number] DOB: April 23, 1986 Today's Date: 07/16/2019   History of Present Illness  Pt admitted for possible CVA, MRI pending at this time. History of tobacco and marijuana use. Pt with complaints of L side weakness and AMS along with jerking episode.  Clinical Impression  Pt is a pleasant 33 year old female who was admitted for possible CVA. MRI still pending at this time. Pt performs bed mobility with cga, transfers with mod A, and ambulation with min/mod assist and RW. Pt demonstrates deficits with L side strength/coordination/sensation in addition to mobility/balance. Currently not at baseline level. Would benefit from skilled PT to address above deficits and promote optimal return to PLOF. Currently recommending CIR placement at this time due to independent PLOF and motivation to improve.    Follow Up Recommendations CIR    Equipment Recommendations  Rolling walker with 5" wheels    Recommendations for Other Services       Precautions / Restrictions Precautions Precautions: Fall Restrictions Weight Bearing Restrictions: No      Mobility  Bed Mobility Overal bed mobility: Needs Assistance Bed Mobility: Supine to Sit     Supine to sit: Min guard     General bed mobility comments: needs cues for sequencing. Very slow transfer noted. Once seated at EOB, increased sway noted with dizziness noted. Visual testing WNL while seated. Needs occasional min assist to prevent LOB while sitting.  Transfers Overall transfer level: Needs assistance Equipment used: Rolling walker (2 wheeled) Transfers: Sit to/from Stand Sit to Stand: Mod assist         General transfer comment: first attempt to stand without RW, very unsteady needing mod assist for balance. 2nd attempt with RW, requiring min/mod assist and min assist to maintain balance with static standing.  Ambulation/Gait Ambulation/Gait assistance: Min  assist;Mod assist Gait Distance (Feet): 10 Feet Assistive device: Rolling walker (2 wheeled) Gait Pattern/deviations: Step-to pattern     General Gait Details: 1st attempt ambulation without AD, able to side step at EOB, needing mod assist and short step to gait pattern. Increased sway and multiple LOB. RW given and pt ambulated to door and back with min/mod assist and cues for stepping. Has difficulty with L foot placement. Responds well to cues  Stairs            Wheelchair Mobility    Modified Rankin (Stroke Patients Only)       Balance Overall balance assessment: Needs assistance Sitting-balance support: Feet supported Sitting balance-Leahy Scale: Fair Sitting balance - Comments: able to maintain balance without HHA, however needs close cga   Standing balance support: No upper extremity supported Standing balance-Leahy Scale: Poor Standing balance comment: increased sway noted                             Pertinent Vitals/Pain Pain Assessment: Faces Faces Pain Scale: Hurts a little bit Pain Location: B hips with sitting Pain Descriptors / Indicators: (stiffness) Pain Intervention(s): Limited activity within patient's tolerance    Home Living Family/patient expects to be discharged to:: Private residence Living Arrangements: (boyfriend, uncle, mom, and 2 children) Available Help at Discharge: Family;Available 24 hours/day Type of Home: House Home Access: Stairs to enter Entrance Stairs-Rails: None Entrance Stairs-Number of Steps: 3 Home Layout: One level Home Equipment: Crutches      Prior Function Level of Independence: Independent         Comments: was indep  with all mobility, working job at [employer], able to sit at job.  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     Hand Dominance        Extremity/Trunk Assessment   Upper Extremity Assessment Upper Extremity Assessment: LUE deficits/detail(R UE grossly WNL) LUE Deficits  / Details: elbow flexor/extension and grip 4/5 LUE Sensation: decreased light touch(all fingers) LUE Coordination: decreased fine motor(with RAMP movement)    Lower Extremity Assessment Lower Extremity Assessment: LLE deficits/detail(R LE grossly 4+/5) LLE Deficits / Details: ankle dorsiflexion/plantarflexion 3/5; knee flex/ext 3+/5 LLE Sensation: decreased light touch(distal to knee) LLE Coordination: decreased fine motor(with heel to shin)       Communication   Communication: No difficulties  Cognition Arousal/Alertness: Awake/alert Behavior During Therapy: WFL for tasks assessed/performed Overall Cognitive Status: Within Functional Limits for tasks assessed                                        General Comments      Exercises Other Exercises Other Exercises: Seated ther-ex performed including L LE AP, LAQ, and marching. All ther-ex performed x 8 reps prior to fatigue with supervision.   Assessment/Plan    PT Assessment Patient needs continued PT services  PT Problem List Decreased strength;Decreased range of motion;Decreased balance;Decreased mobility;Decreased coordination;Decreased knowledge of use of DME;Impaired sensation       PT Treatment Interventions DME instruction;Gait training;Stair training;Therapeutic exercise;Balance training;Neuromuscular re-education    PT Goals (Current goals can be found in the Care Plan section)  Acute Rehab PT Goals Patient Stated Goal: 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 PT Goal Formulation: With patient Time For Goal Achievement: 07/30/19 Potential to Achieve Goals: Good    Frequency 7X/week   Barriers to discharge        Co-evaluation               AM-PAC PT "6 Clicks" Mobility  Outcome Measure Help needed turning from your back to your side while in a flat bed without using bedrails?: A Little Help needed moving from lying on your back to sitting on the side of a flat bed without using bedrails?: A  Little Help needed moving to and from a bed to a chair (including a wheelchair)?: A Little Help needed standing up from a chair using your arms (e.g., wheelchair or bedside chair)?: A Little Help needed to walk in hospital room?: A Lot Help needed climbing 3-5 steps with a railing? : A Lot 6 Click Score: 16    End of Session Equipment Utilized During Treatment: Gait belt Activity Tolerance: Patient tolerated treatment well Patient left: in bed;with bed alarm set Nurse Communication: Mobility status PT Visit Diagnosis: Unsteadiness on feet (R26.81);Muscle weakness (generalized) (M62.81);Difficulty in walking, not elsewhere classified (R26.2);Hemiplegia and hemiparesis Hemiplegia - Right/Left: Left Hemiplegia - dominant/non-dominant: Non-dominant Hemiplegia - caused by: Unspecified    Time: 0930-1010 PT Time Calculation (min) (ACUTE ONLY): 40 min   Charges:   PT Evaluation $PT Eval Low Complexity: 1 Low PT Treatments $Therapeutic Exercise: 8-22 mins $Therapeutic Activity: 8-22 mins        Elizabeth Palau, PT, DPT [phone]   Juanitta Earnhardt 07/16/2019, 11:57 AM
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
## 2019-07-16 NOTE — Progress Notes (Signed)
Rehab Admissions Coordinator Note:  Patient was screened by Michel Santee for appropriateness for an Inpatient Acute Rehab Consult.  Will not request an order at this time.  Await therapy evaluations, and further workup to determine if pt has therapy and medical needs to warrant CIR admission. I will follow from a distance.   Michel Santee 07/16/2019, 12:22 PM  I can be reached at [phone].

## 2019-07-16 NOTE — Progress Notes (Signed)
EEG completed, results pending. 

## 2019-07-16 NOTE — Progress Notes (Signed)
SLP Cancellation Note  Patient Details Name: Christie Oconnor MRN: [id number] DOB: May 27, 1986   Cancelled treatment:       Reason Eval/Treat Not Completed: SLP screened, no needs identified, will sign off  Leroy Sea, MS/CCC- SLP  Valetta Fuller, Daine Floras 07/16/2019, 3:26 PM

## 2019-07-17 ENCOUNTER — Inpatient Hospital Stay: Payer: Medicaid Other

## 2019-07-17 DIAGNOSIS — R531 Weakness: Secondary | ICD-10-CM | POA: Diagnosis not present

## 2019-07-17 LAB — BASIC METABOLIC PANEL
Anion gap: 7 (ref 5–15)
BUN: 15 mg/dL (ref 6–20)
CO2: 25 mmol/L (ref 22–32)
Calcium: 8.5 mg/dL — ABNORMAL LOW (ref 8.9–10.3)
Chloride: 105 mmol/L (ref 98–111)
Creatinine, Ser: 0.69 mg/dL (ref 0.44–1.00)
GFR calc Af Amer: >60 mL/min (ref >60)
GFR calc non Af Amer: >60 mL/min (ref >60)
Glucose, Bld: 100 mg/dL — ABNORMAL HIGH (ref 70–99)
Potassium: 3.6 mmol/L (ref 3.5–5.1)
Sodium: 137 mmol/L (ref 135–145)

## 2019-07-17 LAB — CBC
HCT: 35.2 % — ABNORMAL LOW (ref 36.0–46.0)
Hemoglobin: 12 g/dL (ref 12.0–15.0)
MCH: 32.5 pg (ref 26.0–34.0)
MCHC: 34.1 g/dL (ref 30.0–36.0)
MCV: 95.4 fL (ref 80.0–100.0)
Platelets: 223 10*3/uL (ref 150–400)
RBC: 3.69 MIL/uL — ABNORMAL LOW (ref 3.87–5.11)
RDW: 13.7 % (ref 11.5–15.5)
WBC: 7.8 10*3/uL (ref 4.0–10.5)
nRBC: 0 % (ref 0.0–0.2)

## 2019-07-17 MED ORDER — PANTOPRAZOLE SODIUM 40 MG PO TBEC
40.0000 mg | DELAYED_RELEASE_TABLET | Freq: Every day | ORAL | Status: DC
  Administered 2019-07-17 – 2019-07-19 (×3): 40 mg via ORAL
  Filled 2019-07-17 (×3): qty 1

## 2019-07-17 MED ORDER — LEVETIRACETAM 500 MG PO TABS
500.0000 mg | ORAL_TABLET | Freq: Two times a day (BID) | ORAL | Status: DC
  Administered 2019-07-17 – 2019-07-18 (×2): 500 mg via ORAL
  Filled 2019-07-17 (×4): qty 1

## 2019-07-17 MED ORDER — SODIUM CHLORIDE 0.9 % IV SOLN
1000.0000 mg | Freq: Every day | INTRAVENOUS | Status: AC
  Administered 2019-07-17 – 2019-07-19 (×3): 1000 mg via INTRAVENOUS
  Filled 2019-07-17 (×3): qty 8

## 2019-07-17 MED ORDER — GADOBUTROL 1 MMOL/ML IV SOLN
7.0000 mL | Freq: Once | INTRAVENOUS | Status: AC | PRN
  Administered 2019-07-17: 7 mL via INTRAVENOUS

## 2019-07-17 MED ORDER — SODIUM CHLORIDE 0.9% FLUSH
3.0000 mL | Freq: Two times a day (BID) | INTRAVENOUS | Status: DC
  Administered 2019-07-17 – 2019-07-18 (×5): 3 mL via INTRAVENOUS

## 2019-07-17 NOTE — Progress Notes (Signed)
Pt on phone frequently with conversations with pt verbalizing she is upset to person on phone. When took tylenol in for HA, pt talking on phone and admits to have been crying but did " not want talk about it". Tylenol given. Pt encouraged to have some quiet time/decrease stress. Dr. Doy Mince advises she has spoken with pt in regards to concern for MS. Emotional support given. Pt reports intermittent tingling of left face, LUE and LLE. Speech slow/mildly slurred at midday check. MRI results noted. Ambulates readily with walker.

## 2019-07-17 NOTE — Progress Notes (Signed)
Physical Therapy Treatment Patient Details Name: Christie Oconnor MRN: [id number] DOB: 06/27/1986 Today's Date: 07/17/2019    History of Present Illness Pt admitted for possible CVA, MRI (-) for acute infarct, however showed "Scattered periventricular deep white matter hyperintensities bilaterally". History of tobacco and marijuana use. Pt with complaints of L side weakness and AMS along with jerking episode.    PT Comments    To edge of bed without assist.  Stood with min guard and verbal cues for hand placements.  Able to ambulate x 2 around nursing unit with RW and min guard/supervision with seated rest back in room between laps.  Continues to report some tingling in LLE and UE which limits gait. No LOB or buckling noted.  Original recommendations for CIR noted.  Pt generally progressing well with mobility and should be able to return home with +1 assist for safety as needed with RW. OPPT is recommended for follow up.    Follow Up Recommendations  Outpatient PT;Supervision for mobility/OOB     Equipment Recommendations  Rolling walker with 5" wheels    Recommendations for Other Services       Precautions / Restrictions Precautions Precautions: Fall Restrictions Weight Bearing Restrictions: No    Mobility  Bed Mobility Overal bed mobility: Modified Independent                Transfers Overall transfer level: Needs assistance Equipment used: Rolling walker (2 wheeled) Transfers: Sit to/from Stand Sit to Stand: Min guard;Supervision         General transfer comment: increased time but no physical assist.  Ambulation/Gait Ambulation/Gait assistance: Min guard Gait Distance (Feet): 130 Feet Assistive device: Rolling walker (2 wheeled) Gait Pattern/deviations: Step-through pattern Gait velocity: decreased   General Gait Details: 130' x 2 - around small nursing unit with seated rest in room   Stairs             Wheelchair Mobility    Modified  Rankin (Stroke Patients Only)       Balance Overall balance assessment: Needs assistance Sitting-balance support: Feet supported Sitting balance-Leahy Scale: Good     Standing balance support: Bilateral upper extremity supported Standing balance-Leahy Scale: Fair Standing balance comment: Relies on walker but overall improved from yesterday.                            Cognition Arousal/Alertness: Awake/alert Behavior During Therapy: WFL for tasks assessed/performed Overall Cognitive Status: Within Functional Limits for tasks assessed                                        Exercises      General Comments        Pertinent Vitals/Pain Pain Assessment: Faces Faces Pain Scale: Hurts little more Pain Location: L LE and some L UE Pain Descriptors / Indicators: Tingling Pain Intervention(s): Monitored during session    Home Living                      Prior Function            PT Goals (current goals can now be found in the care plan section) Progress towards PT goals: Progressing toward goals    Frequency    7X/week      PT Plan Discharge plan needs to be updated    Co-evaluation  AM-PAC PT "6 Clicks" Mobility   Outcome Measure  Help needed turning from your back to your side while in a flat bed without using bedrails?: None Help needed moving from lying on your back to sitting on the side of a flat bed without using bedrails?: None Help needed moving to and from a bed to a chair (including a wheelchair)?: A Little Help needed standing up from a chair using your arms (e.g., wheelchair or bedside chair)?: A Little Help needed to walk in hospital room?: A Little Help needed climbing 3-5 steps with a railing? : A Little 6 Click Score: 20    End of Session Equipment Utilized During Treatment: Gait belt Activity Tolerance: Patient tolerated treatment well Patient left: in bed;with bed alarm set;with call  bell/phone within reach;with family/visitor present Nurse Communication: Mobility status Hemiplegia - Right/Left: Left Hemiplegia - dominant/non-dominant: Non-dominant     Time: 1032-1050 PT Time Calculation (min) (ACUTE ONLY): 18 min  Charges:  $Gait Training: 8-22 mins                     Chesley Noon, PTA 07/17/19, 11:36 AM

## 2019-07-17 NOTE — Progress Notes (Addendum)
Occupational Therapy Treatment Patient Details Name: Christie Oconnor MRN: [id number] DOB: Nov 25, 1985 Today's Date: 07/17/2019    History of present illness Pt admitted for possible CVA, MRI (-) for acute infarct, however showed "Scattered periventricular deep white matter hyperintensities bilaterally". History of tobacco and marijuana use. Pt with complaints of L side weakness and AMS along with jerking episode.   OT comments  Pt. Was able to independently donn, and hike pants at the EOB. Pt. Performed toileting transfers with CGA-Supervision. Pt. Was independent with toilet hygiene care, and supervision hand hygiene in standing. Pt. has improved left UE strength, FM coordination skills, and thumb opposition. Pt. education was provided about opportunities to engage the LUE during ADLs, and IADL tasks. Pt. continues to benefit from OT services for ADL training, A/E training as needed, neuromuscular re-education, and pt. education about home modification, and DME to maximize independence with daily care tasks.   Follow Up Recommendations  Home health OT    Equipment Recommendations       Recommendations for Other Services      Precautions / Restrictions Precautions Precautions: Fall Restrictions Weight Bearing Restrictions: No       Mobility Bed Mobility Overal bed mobility: Independent                Transfers Overall transfer level: Needs assistance Equipment used: Rolling walker (2 wheeled) Transfers: Sit to/from Stand Sit to Stand: Min guard         General transfer comment: increased time but no physical assist.    Balance Overall balance assessment: Needs assistance Sitting-balance support: Feet supported Sitting balance-Leahy Scale: Good     Standing balance support: Bilateral upper extremity supported Standing balance-Leahy Scale: Fair Standing balance comment: Relies on walker but overall improved from yesterday.                            ADL either performed or assessed with clinical judgement   ADL Overall ADL's : Needs assistance/impaired Eating/Feeding: Independent Eating/Feeding Details (indicate cue type and reason): assist opening packets Grooming: Supervision/safety;Standing;Set up   Upper Body Bathing: Independent   Lower Body Bathing: Independent   Upper Body Dressing : Independent   Lower Body Dressing: Independent     Toilet Transfer Details (indicate cue type and reason): Pt ambulates to room commode using a RW for stability this date. She is able to lower herself onto low commode using handrail for stability. Toileting- Architect and Hygiene: Independent      Functional mobility during ADLs: Min guard;Supervision/safety;Rolling walker       Vision Baseline Vision/History: No visual deficits     Perception     Praxis      Cognition Arousal/Alertness: Awake/alert Behavior During Therapy: WFL for tasks assessed/performed Overall Cognitive Status: Within Functional Limits for tasks assessed                                          Exercises     Shoulder Instructions       General Comments      Pertinent Vitals/ Pain       Pain Assessment: 0-10 Pain Score: 5  Faces Pain Scale: Hurts little more Pain Location: headache Pain Descriptors / Indicators: Aching Pain Intervention(s): Monitored during session  Home Living  Prior Functioning/Environment              Frequency  Min 2X/week        Progress Toward Goals  OT Goals(current goals can now be found in the care plan section)  Progress towards OT goals: Progressing toward goals  Acute Rehab OT Goals OT Goal Formulation: With patient Potential to Achieve Goals: Good  Plan      Co-evaluation                 AM-PAC OT "6 Clicks" Daily Activity     Outcome Measure   Help from another person eating meals?: A  Little Help from another person taking care of personal grooming?: A Little Help from another person toileting, which includes using toliet, bedpan, or urinal?: A Little Help from another person bathing (including washing, rinsing, drying)?: A Little Help from another person to put on and taking off regular upper body clothing?: A Little Help from another person to put on and taking off regular lower body clothing?: A Little 6 Click Score: 18    End of Session Equipment Utilized During Treatment: Gait belt;Rolling walker  OT Visit Diagnosis: Other abnormalities of gait and mobility (R26.89);Hemiplegia and hemiparesis Hemiplegia - Right/Left: Left Hemiplegia - dominant/non-dominant: Non-Dominant Hemiplegia - caused by: Unspecified   Activity Tolerance Patient tolerated treatment well   Patient Left in bed;with call bell/phone within reach;with bed alarm set;with family/visitor present   Nurse Communication          Time: 1300-1330 OT Time Calculation (min): 30 min  Charges: OT General Charges $OT Visit: 1 Visit OT Treatments $Self Care/Home Management : 23-37 mins  Harrel Carina, MS, OTR/L  Harrel Carina 07/17/2019, 2:38 PM

## 2019-07-17 NOTE — Progress Notes (Signed)
[facility] NAME: Malya Cirillo    MR#:  [id number]  DATE OF BIRTH:  [birth year]  SUBJECTIVE:   Patient states she is feeling better today.  She is answering questions appropriately.  Patient still has left lower extremity weakness but reports improvement.  Ambulated better with physical therapist today.   REVIEW OF SYSTEMS:  Review of Systems  Constitutional: Negative for chills and fever.  HENT: Negative for congestion and sore throat.   Eyes: Negative for blurred vision and double vision.  Respiratory: Negative for cough and shortness of breath.   Cardiovascular: Negative for chest pain and palpitations.  Gastrointestinal: Negative for nausea and vomiting.  Genitourinary: Negative for dysuria and urgency.  Musculoskeletal: Negative for back pain and neck pain.  Neurological: Positive for sensory change and focal weakness. Negative for dizziness, speech change and headaches.  Psychiatric/Behavioral: Negative for depression. The patient is not nervous/anxious.     DRUG ALLERGIES:  No Known Allergies VITALS:  Blood pressure 122/84, pulse 72, temperature 98.1 F (36.7 C), temperature source Oral, resp. rate 20, height 5\' 9"  (1.753 m), weight 79.7 kg, SpO2 100 %. PHYSICAL EXAMINATION:  Physical Exam  GENERAL:  Laying in the bed with no acute distress.  HEENT: Head atraumatic, normocephalic. Pupils equal, round, reactive to light and accommodation. No scleral icterus. Extraocular muscles intact. Oropharynx and nasopharynx clear.  NECK:  Supple, no jugular venous distention. No thyroid enlargement. LUNGS: Lungs are clear to auscultation bilaterally. No wheezes, crackles, rhonchi. No use of accessory muscles of respiration.  CARDIOVASCULAR: S1, S2 normal. No murmurs, rubs, or gallops.  ABDOMEN: Soft, nontender, nondistended. Bowel sounds present.  EXTREMITIES: No pedal edema, cyanosis, or clubbing.  NEUROLOGIC:  5/5 muscle  strength in the RUE, RLE, LUE, 4/5 muscle strength in the left leg. Sensation intact to light touch throughout. Gait not checked.  PSYCHIATRIC: The patient is alert and oriented x 3.  SKIN: No obvious rash, lesion, or ulcer.  LABORATORY PANEL:  Female CBC Recent Labs  Lab 07/17/19 0603  WBC 7.8  HGB 12.0  HCT 35.2*  PLT 223   ------------------------------------------------------------------------------------------------------------------ Chemistries  Recent Labs  Lab 07/15/19 2127 07/17/19 0603  NA 137 137  K 3.7 3.6  CL 103 105  CO2 21* 25  GLUCOSE 76 100*  BUN 20 15  CREATININE 0.88 0.69  CALCIUM 8.8* 8.5*  AST 21  --   ALT 15  --   ALKPHOS 38  --   BILITOT 0.7  --    RADIOLOGY:  Mr Jeri Cos Contrast  Result Date: 07/17/2019 CLINICAL DATA:  Muscle weakness.  Rule out multiple sclerosis. EXAM: MRI HEAD WITH CONTRAST TECHNIQUE: Multiplanar, multiecho pulse sequences of the brain and surrounding structures were obtained with intravenous contrast. CONTRAST:  67mL GADAVIST GADOBUTROL 1 MMOL/ML IV SOLN COMPARISON:  Unenhanced MRI head 07/16/2019 FINDINGS: Multiple white matter lesions are present bilaterally. Left occipital and temporal periventricular white matter lesions. Right frontal periventricular and deep white matter hyperintensities. Negative brainstem. Ventricle size normal. Normal enhancement.  No enhancing lesions identified. IMPRESSION: Bilateral white matter lesions, suspicious for multiple sclerosis. No enhancing lesions present. Electronically Signed   By: Franchot Gallo M.D.   On: 07/17/2019 12:54   ASSESSMENT AND PLAN:   Newly diagnosed multiple sclerosis  Patient with left-sided hemiparesis; more on the left lower extremity MRI of the brain done with no acute infarct but revealed scattered periventricular deep white matter hyperintensities suspicious for multiple sclerosis.  Patient evaluated by neurologist this morning and subsequently had MRI of the brain with  contrast which revealed bilateral white matter lesions, suspicious for multiple sclerosis.No enhancing lesions present. Neurologist has initiated high-dose IV Solu-Medrol at 1 g daily x3 days.  Added Protonix. Patient reevaluated by physical therapist today and recommended home health with physical therapy with rolling walker with one assist as needed on discharge. Physical therapy to continue to follow patient  Seizure-like activity- no new seizure-like episodes this admission. -Continue Keppra -EEG normal .Neurology following  Tobacco use -Tobacco cessation counseling performed this admission  Marijuana use -Substance cessation counseling performed  DVT prophylaxis; Lovenox  All the records are reviewed and case discussed with Care Management/Social Worker. Management plans discussed with the patient, family and they are in agreement.  CODE STATUS: Full Code  TOTAL TIME TAKING CARE OF THIS PATIENT: 33 minutes.   More than 50% of the time was spent in counseling/coordination of care: YES  POSSIBLE D/C IN 3 DAYS, DEPENDING ON CLINICAL CONDITION.   Naiyah Klostermann M.D on 07/17/2019 at 2:23 PM  Between 7am to 6pm - Pager - [phone]  After 6pm go to [email or web address] - Social research officer, government  [facility]  Office  [phone]  CC: Primary care physician; Patient, No Pcp Per  Note: This dictation was prepared with Dragon dictation along with smaller phrase technology. Any transcriptional errors that result from this process are unintentional.

## 2019-07-17 NOTE — Progress Notes (Signed)
Subjective: Patient reports no further seizure like activity.  Reports that she feels she is back to baseline.    Objective: Current vital signs: BP 105/69 (BP Location: Right Arm)   Pulse 62   Temp 98.1 F (36.7 C) (Oral)   Resp 20   Ht 5\' 9"  (1.753 m)   Wt 79.7 kg   SpO2 99%   BMI 25.95 kg/m  Vital signs in last 24 hours: Temp:  [98 F (36.7 C)-98.4 F (36.9 C)] 98.1 F (36.7 C) (10/24 0839) Pulse Rate:  [54-89] 62 (10/24 0839) Resp:  [18-20] 20 (10/24 0839) BP: (97-117)/(65-76) 105/69 (10/24 0839) SpO2:  [99 %-100 %] 99 % (10/24 0839)  Intake/Output from previous day: 10/23 0701 - 10/24 0700 In: 711.7 [I.V.:711.7] Out: -  Intake/Output this shift: No intake/output data recorded. Nutritional status:  Diet Order            Diet Heart Room service appropriate? Yes; Fluid consistency: Thin  Diet effective now              Neurologic Exam: Mental Status: Awake and alert.  Oriented, thought content appropriate.  Speech fluent without evidence of aphasia.  Able to follow 3 step commands without difficulty. Cranial Nerves: II: Discs flat bilaterally; Visual fields grossly normal, pupils equal, round, reactive to light and accommodation III,IV, VI: ptosis not present, extra-ocular motions intact bilaterally V,VII: smile symmetric, facial light touch sensation normal bilaterally VIII: hearing normal bilaterally IX,X: gag reflex present XI: bilateral shoulder shrug XII: midline tongue extension Motor: Patient able to lift both upper extremities against gravity with drift of the LUE but no pronation.  Gives very little effort in lifting either of the lower extremities, left greater than right.  Tone and bulk:normal tone throughout; no atrophy noted Sensory: Pinprick and light touch intact throughout, bilaterally   Lab Results: Basic Metabolic Panel: Recent Labs  Lab 07/15/19 2127 07/17/19 0603  NA 137 137  K 3.7 3.6  CL 103 105  CO2 21* 25  GLUCOSE 76 100*   BUN 20 15  CREATININE 0.88 0.69  CALCIUM 8.8* 8.5*    Liver Function Tests: Recent Labs  Lab 07/15/19 2127  AST 21  ALT 15  ALKPHOS 38  BILITOT 0.7  PROT 7.1  ALBUMIN 3.8   No results for input(s): LIPASE, AMYLASE in the last 168 hours. No results for input(s): AMMONIA in the last 168 hours.  CBC: Recent Labs  Lab 07/15/19 2127 07/17/19 0603  WBC 10.3 7.8  NEUTROABS 6.6  --   HGB 13.2 12.0  HCT 38.9 35.2*  MCV 95.3 95.4  PLT 257 223    Cardiac Enzymes: No results for input(s): CKTOTAL, CKMB, CKMBINDEX, TROPONINI in the last 168 hours.  Lipid Panel: Recent Labs  Lab 07/16/19 0500  CHOL 168  TRIG 50  HDL 63  CHOLHDL 2.7  VLDL 10  LDLCALC 95    CBG: Recent Labs  Lab 07/15/19 2122 07/15/19 2207  GLUCAP 66* 83    Microbiology: Results for orders placed or performed during the hospital encounter of 07/15/19  SARS CORONAVIRUS 2 (TAT 6-24 HRS) Nasopharyngeal Nasopharyngeal Swab     Status: None   Collection Time: 07/16/19 12:31 AM   Specimen: Nasopharyngeal Swab  Result Value Ref Range Status   SARS Coronavirus 2 NEGATIVE NEGATIVE Final    Comment: (NOTE) SARS-CoV-2 target nucleic acids are NOT DETECTED. The SARS-CoV-2 RNA is generally detectable in upper and lower respiratory specimens during the acute phase of infection. Negative  results do not preclude SARS-CoV-2 infection, do not rule out co-infections with other pathogens, and should not be used as the sole basis for treatment or other patient management decisions. Negative results must be combined with clinical observations, patient history, and epidemiological information. The expected result is Negative. Fact Sheet for Patients: HairSlick.no Fact Sheet for Healthcare Providers: quierodirigir.com This test is not yet approved or cleared by the [place] FDA and  has been authorized for detection and/or diagnosis of SARS-CoV-2 by FDA  under an Emergency Use Authorization (EUA). This EUA will remain  in effect (meaning this test can be used) for the duration of the COVID-19 declaration under Section 56 4(b)(1) of the Act, 21 U.S.C. section 360bbb-3(b)(1), unless the authorization is terminated or revoked sooner. Performed at [facility], [address] [place], [place] [address]     Coagulation Studies: Recent Labs    07/15/19 01-17-26  LABPROT 13.1  INR 1.0    Imaging: Ct Code Stroke Cta Head W/wo Contrast  Result Date: 07/15/2019 CLINICAL DATA:  Initial evaluation for acute left-sided weakness, numbness. EXAM: CT ANGIOGRAPHY HEAD AND NECK TECHNIQUE: Multidetector CT imaging of the head and neck was performed using the standard protocol during bolus administration of intravenous contrast. Multiplanar CT image reconstructions and MIPs were obtained to evaluate the vascular anatomy. Carotid stenosis measurements (when applicable) are obtained utilizing NASCET criteria, using the distal internal carotid diameter as the denominator. CONTRAST:  25mL OMNIPAQUE IOHEXOL 350 MG/ML SOLN COMPARISON:  None available. FINDINGS: CT HEAD FINDINGS Brain: Cerebral volume within normal limits for patient age. No evidence for acute intracranial hemorrhage. No findings to suggest acute large vessel territory infarct. No mass lesion, midline shift, or mass effect. Ventricles are normal in size without evidence for hydrocephalus. No extra-axial fluid collection identified. Vascular: No hyperdense vessel identified. Skull: Scalp soft tissues demonstrate no acute abnormality. Calvarium intact. Sinuses/Orbits: Globes and orbital soft tissues within normal limits. Visualized paranasal sinuses are clear. No mastoid effusion. CTA NECK FINDINGS Aortic arch: Visualized aortic arch of normal caliber with normal 3 vessel morphology. No hemodynamically significant stenosis or other abnormality about the origin of the great vessels. Visualized  subclavian arteries widely patent. Right carotid system: Right common and internal carotid arteries widely patent without stenosis, dissection, or occlusion. Minimal atheromatous plaque about the right bifurcation without stenosis. Left carotid system: Left common and internal carotid arteries widely patent without stenosis, dissection or occlusion. No significant atheromatous narrowing about the left bifurcation. Vertebral arteries: Both vertebral arteries arise from the subclavian arteries. Vertebral arteries widely patent within the neck without stenosis, dissection or occlusion. Skeleton: No acute osseous abnormality. No discrete lytic or blastic osseous lesions. Other neck: No other acute soft tissue abnormality within the neck. Upper chest: Visualized upper chest demonstrates no acute finding. Review of the MIP images confirms the above findings CTA HEAD FINDINGS Anterior circulation: Both internal carotid arteries widely patent to the termini without stenosis. A1 segments, anterior communicating artery common anterior cerebral arteries widely patent. No M1 stenosis or occlusion. Normal MCA bifurcations. Distal MCA branches well perfused and symmetric. Posterior circulation: Both vertebral arteries widely patent to the vertebrobasilar junction without stenosis. Both picas patent proximally. Basilar widely patent to its distal aspect. Superior cerebral arteries patent bilaterally. Both PCAs primarily supplied via the basilar and are well perfused to their distal aspects. Venous sinuses: Patent. Anatomic variants: None significant. Review of the MIP images confirms the above findings IMPRESSION: CT HEAD IMPRESSION: 1. Normal head CT.  No acute intracranial abnormality identified. 2. Aspects equals 10/10. CTA HEAD AND NECK IMPRESSION: Normal CTA of the head and neck. No large vessel occlusion, hemodynamically significant stenosis, or other acute abnormality. Critical Value/emergent results were called by  telephone at the time of interpretation on 07/15/2019 at 10:26 pm to [facility] , who verbally acknowledged these results. Electronically Signed   By: Jeannine Boga M.D.   On: 07/15/2019 22:52   Ct Code Stroke Cta Neck W/wo Contrast  Result Date: 07/15/2019 CLINICAL DATA:  Initial evaluation for acute left-sided weakness, numbness. EXAM: CT ANGIOGRAPHY HEAD AND NECK TECHNIQUE: Multidetector CT imaging of the head and neck was performed using the standard protocol during bolus administration of intravenous contrast. Multiplanar CT image reconstructions and MIPs were obtained to evaluate the vascular anatomy. Carotid stenosis measurements (when applicable) are obtained utilizing NASCET criteria, using the distal internal carotid diameter as the denominator. CONTRAST:  34mL OMNIPAQUE IOHEXOL 350 MG/ML SOLN COMPARISON:  None available. FINDINGS: CT HEAD FINDINGS Brain: Cerebral volume within normal limits for patient age. No evidence for acute intracranial hemorrhage. No findings to suggest acute large vessel territory infarct. No mass lesion, midline shift, or mass effect. Ventricles are normal in size without evidence for hydrocephalus. No extra-axial fluid collection identified. Vascular: No hyperdense vessel identified. Skull: Scalp soft tissues demonstrate no acute abnormality. Calvarium intact. Sinuses/Orbits: Globes and orbital soft tissues within normal limits. Visualized paranasal sinuses are clear. No mastoid effusion. CTA NECK FINDINGS Aortic arch: Visualized aortic arch of normal caliber with normal 3 vessel morphology. No hemodynamically significant stenosis or other abnormality about the origin of the great vessels. Visualized subclavian arteries widely patent. Right carotid system: Right common and internal carotid arteries widely patent without stenosis, dissection, or occlusion. Minimal atheromatous plaque about the right bifurcation without stenosis. Left carotid system: Left common  and internal carotid arteries widely patent without stenosis, dissection or occlusion. No significant atheromatous narrowing about the left bifurcation. Vertebral arteries: Both vertebral arteries arise from the subclavian arteries. Vertebral arteries widely patent within the neck without stenosis, dissection or occlusion. Skeleton: No acute osseous abnormality. No discrete lytic or blastic osseous lesions. Other neck: No other acute soft tissue abnormality within the neck. Upper chest: Visualized upper chest demonstrates no acute finding. Review of the MIP images confirms the above findings CTA HEAD FINDINGS Anterior circulation: Both internal carotid arteries widely patent to the termini without stenosis. A1 segments, anterior communicating artery common anterior cerebral arteries widely patent. No M1 stenosis or occlusion. Normal MCA bifurcations. Distal MCA branches well perfused and symmetric. Posterior circulation: Both vertebral arteries widely patent to the vertebrobasilar junction without stenosis. Both picas patent proximally. Basilar widely patent to its distal aspect. Superior cerebral arteries patent bilaterally. Both PCAs primarily supplied via the basilar and are well perfused to their distal aspects. Venous sinuses: Patent. Anatomic variants: None significant. Review of the MIP images confirms the above findings IMPRESSION: CT HEAD IMPRESSION: 1. Normal head CT.  No acute intracranial abnormality identified. 2. Aspects equals 10/10. CTA HEAD AND NECK IMPRESSION: Normal CTA of the head and neck. No large vessel occlusion, hemodynamically significant stenosis, or other acute abnormality. Critical Value/emergent results were called by telephone at the time of interpretation on 07/15/2019 at 10:26 pm to [facility] , who verbally acknowledged these results. Electronically Signed   By: Jeannine Boga M.D.   On: 07/15/2019 22:52   Mr Brain Wo Contrast  Result Date: 07/16/2019 CLINICAL  DATA:  Hemiplegia. Left facial droop and slurred  speech. Headache. EXAM: MRI HEAD WITHOUT CONTRAST TECHNIQUE: Multiplanar, multiecho pulse sequences of the brain and surrounding structures were obtained without intravenous contrast. COMPARISON:  CT a head and neck 07/15/2019 FINDINGS: Brain: Negative for acute infarct. Ventricle size and cerebral volume normal. Scattered white matter lesions. Combination of periventricular and deep white matter hyperintensities. Hyperintensity also in the right posterior external capsule. Brainstem and cerebellum intact. Negative for hemorrhage or mass. Vascular: Normal arterial flow voids Skull and upper cervical spine: Negative Sinuses/Orbits: Mild mucosal edema paranasal sinuses.  Normal orbit Other: None IMPRESSION: Negative for acute infarct Scattered periventricular deep white matter hyperintensities bilaterally, suspicious for multiple sclerosis. Other possibilities include chronic ischemia, vasculitis, complex migraine headaches Electronically Signed   By: Marlan Palau M.D.   On: 07/16/2019 12:12    Medications:  I have reviewed the patient's current medications. Scheduled: . aspirin  300 mg Rectal Daily   Or  . aspirin EC  325 mg Oral Daily  . atorvastatin  10 mg Oral q1800  . enoxaparin (LOVENOX) injection  40 mg Subcutaneous Q24H  . levETIRAcetam  1,000 mg Oral BID  . sodium chloride flush  3 mL Intravenous Q12H    Assessment/Plan: 33 year old female presenting presenting with seizure like activity and left sided weakness.  Patient giving little effort on examination this morning.  MRI of the brain reviewed and shows no acute changes but there are some T2 lesions in the deep periventricular regions.  MS has been raised as an etiology.  Patient refuses LP in work up at this time.   EEG unremarkable.  Recommendations: 1. MRI of the brain with contrast to be performed to look for contrast enhancement of lesions 2. PT to re-evaluate patient 3. If  patient still significantly impaired with therapy or enhancement noted on MRI would start high dose Solumedrol at 1g daily for 3 days.  Otherwise would discharge to home with neurology follow up.  Patient will need repeat imaging on an outpatient basis in 6-12 months.   4. Would not start anticonvulsant therapy at this time.         LOS: 2 days   Thana Farr, MD Neurology [phone] 07/17/2019  10:25 AM

## 2019-07-18 DIAGNOSIS — R531 Weakness: Secondary | ICD-10-CM | POA: Diagnosis not present

## 2019-07-18 LAB — BASIC METABOLIC PANEL
Anion gap: 12 (ref 5–15)
BUN: 18 mg/dL (ref 6–20)
CO2: 23 mmol/L (ref 22–32)
Calcium: 9.7 mg/dL (ref 8.9–10.3)
Chloride: 102 mmol/L (ref 98–111)
Creatinine, Ser: 0.78 mg/dL (ref 0.44–1.00)
GFR calc Af Amer: >60 mL/min (ref >60)
GFR calc non Af Amer: >60 mL/min (ref >60)
Glucose, Bld: 139 mg/dL — ABNORMAL HIGH (ref 70–99)
Potassium: 3.8 mmol/L (ref 3.5–5.1)
Sodium: 137 mmol/L (ref 135–145)

## 2019-07-18 LAB — MAGNESIUM: Magnesium: 1.8 mg/dL (ref 1.7–2.4)

## 2019-07-18 MED ORDER — LEVETIRACETAM 250 MG PO TABS
250.0000 mg | ORAL_TABLET | Freq: Two times a day (BID) | ORAL | Status: DC
  Administered 2019-07-18 – 2019-07-19 (×2): 250 mg via ORAL
  Filled 2019-07-18 (×3): qty 1

## 2019-07-18 NOTE — Progress Notes (Signed)
[facility] [place] NAME: Christie Oconnor    MR#:  [id number]  DATE OF BIRTH:  [birth year]  SUBJECTIVE:   Patient states she is feeling better today.  Patient reports improvement in left lower extremity weakness since initiation of IV salmeterol yesterday  REVIEW OF SYSTEMS:  Review of Systems  Constitutional: Negative for chills and fever.  HENT: Negative for congestion and sore throat.   Eyes: Negative for blurred vision and double vision.  Respiratory: Negative for cough and shortness of breath.   Cardiovascular: Negative for chest pain and palpitations.  Gastrointestinal: Negative for nausea and vomiting.  Genitourinary: Negative for dysuria and urgency.  Musculoskeletal: Negative for back pain and neck pain.  Neurological: Negative for dizziness, sensory change, speech change, focal weakness and headaches.       Left lower extremity weakness improving  Psychiatric/Behavioral: Negative for depression. The patient is not nervous/anxious.     DRUG ALLERGIES:  No Known Allergies VITALS:  Blood pressure 109/72, pulse 64, temperature 98 F (36.7 C), temperature source Oral, resp. rate 18, height 5\' 9"  (1.753 m), weight 79.7 kg, SpO2 98 %. PHYSICAL EXAMINATION:  Physical Exam  GENERAL:  Laying in the bed with no acute distress.  HEENT: Head atraumatic, normocephalic. Pupils equal, round, reactive to light and accommodation. No scleral icterus. Extraocular muscles intact. Oropharynx and nasopharynx clear.  NECK:  Supple, no jugular venous distention. No thyroid enlargement. LUNGS: Lungs are clear to auscultation bilaterally. No wheezes, crackles, rhonchi. No use of accessory muscles of respiration.  CARDIOVASCULAR: S1, S2 normal. No murmurs, rubs, or gallops.  ABDOMEN: Soft, nontender, nondistended. Bowel sounds present.  EXTREMITIES: No pedal edema, cyanosis, or clubbing.  NEUROLOGIC:  5/5 muscle strength in the RUE, RLE, LUE, 4/5  muscle strength in the left leg. Sensation intact to light touch throughout. Gait not checked.  PSYCHIATRIC: The patient is alert and oriented x 3.  SKIN: No obvious rash, lesion, or ulcer.  LABORATORY PANEL:  Female CBC Recent Labs  Lab 07/17/19 0603  WBC 7.8  HGB 12.0  HCT 35.2*  PLT 223   ------------------------------------------------------------------------------------------------------------------ Chemistries  Recent Labs  Lab 07/15/19 2127  07/18/19 0551  NA 137   < > 137  K 3.7   < > 3.8  CL 103   < > 102  CO2 21*   < > 23  GLUCOSE 76   < > 139*  BUN 20   < > 18  CREATININE 0.88   < > 0.78  CALCIUM 8.8*   < > 9.7  MG  --   --  1.8  AST 21  --   --   ALT 15  --   --   ALKPHOS 38  --   --   BILITOT 0.7  --   --    < > = values in this interval not displayed.   RADIOLOGY:  No results found. ASSESSMENT AND PLAN:   Newly diagnosed multiple sclerosis  Patient with left-sided hemiparesis; more on the left lower extremity MRI of the brain done with no acute infarct but revealed scattered periventricular deep white matter hyperintensities suspicious for multiple sclerosis.  Patient evaluated by neurologist this morning and subsequently had MRI of the brain with contrast which revealed bilateral white matter lesions, suspicious for multiple sclerosis.No enhancing lesions present. Neurologist has initiated high-dose IV Solu-Medrol yesterday at 1 g daily x3 days.  Added Protonix. Patient reevaluated by physical therapist today and recommended outpatient  physical therapy with supervision for mobility and out of bed.  Patient reevaluated by neurologist this morning.  If patient remains stable after third dose of steroid tomorrow, to be discharged home.  Steroid taper not necessary.  Patient to follow-up with neurology as outpatient.    Seizure-like activity- no new seizure-like episodes this admission. -Continue Keppra -EEG normal .Neurology following  Tobacco use  -Tobacco cessation counseling performed this admission  Marijuana use -Substance cessation counseling performed  DVT prophylaxis; Lovenox  All the records are reviewed and case discussed with Care Management/Social Worker. Management plans discussed with the patient, family and they are in agreement.  CODE STATUS: Full Code  TOTAL TIME TAKING CARE OF THIS PATIENT: 28 minutes.   More than 50% of the time was spent in counseling/coordination of care: YES  POSSIBLE D/C IN 1-2 DAYS, DEPENDING ON CLINICAL CONDITION.   Ledora Delker M.D on 07/18/2019 at 1:16 PM  Between 7am to 6pm - Pager - [phone]  After 6pm go to [email or web address] - Social research officer, government  [facility]  Office  [phone]  CC: Primary care physician; Patient, No Pcp Per  Note: This dictation was prepared with Dragon dictation along with smaller phrase technology. Any transcriptional errors that result from this process are unintentional.

## 2019-07-18 NOTE — Progress Notes (Signed)
Subjective: Patient reports feeling improved today.  Tolerating Solumedrol.  On Day #2.  Objective: Current vital signs: BP 109/72 (BP Location: Right Arm)   Pulse 64   Temp 98 F (36.7 C) (Oral)   Resp 18   Ht 5\' 9"  (1.753 m)   Wt 79.7 kg   SpO2 98%   BMI 25.95 kg/m  Vital signs in last 24 hours: Temp:  [98 F (36.7 C)-98.3 F (36.8 C)] 98 F (36.7 C) (10/25 0646) Pulse Rate:  [47-81] 64 (10/25 0654) Resp:  [18-20] 18 (10/25 0646) BP: (109-133)/(72-95) 109/72 (10/25 0646) SpO2:  [98 %-100 %] 98 % (10/25 0646)  Intake/Output from previous day: 10/24 0701 - 10/25 0700 In: 768.4 [P.O.:720; IV Piggyback:48.4] Out: -  Intake/Output this shift: No intake/output data recorded. Nutritional status:  Diet Order            Diet regular Room service appropriate? Yes; Fluid consistency: Thin  Diet effective now              Neurologic Exam: Mental Status: Alert, oriented, thought content appropriate.  Speech fluent without evidence of aphasia.  Able to follow 3 step commands without difficulty. Cranial Nerves: II: Discs flat bilaterally; Visual fields grossly normal, pupils equal, round, reactive to light and accommodation III,IV, VI: ptosis not present, extra-ocular motions intact bilaterally V,VII: smile symmetric, facial light touch sensation normal bilaterally VIII: hearing normal bilaterally IX,X: gag reflex present XI: bilateral shoulder shrug XII: midline tongue extension Motor: Right : Upper extremity   5/5    Left:     Upper extremity   5/5. No drift noted  Lower extremity   5/5     Lower extremity   5/5 Tone and bulk:normal tone throughout; no atrophy noted   Lab Results: Basic Metabolic Panel: Recent Labs  Lab 07/15/19 2127 07/17/19 0603 07/18/19 0551  NA 137 137 137  K 3.7 3.6 3.8  CL 103 105 102  CO2 21* 25 23  GLUCOSE 76 100* 139*  BUN 20 15 18   CREATININE 0.88 0.69 0.78  CALCIUM 8.8* 8.5* 9.7  MG  --   --  1.8    Liver Function  Tests: Recent Labs  Lab 07/15/19 2127  AST 21  ALT 15  ALKPHOS 38  BILITOT 0.7  PROT 7.1  ALBUMIN 3.8   No results for input(s): LIPASE, AMYLASE in the last 168 hours. No results for input(s): AMMONIA in the last 168 hours.  CBC: Recent Labs  Lab 07/15/19 2127 07/17/19 0603  WBC 10.3 7.8  NEUTROABS 6.6  --   HGB 13.2 12.0  HCT 38.9 35.2*  MCV 95.3 95.4  PLT 257 223    Cardiac Enzymes: No results for input(s): CKTOTAL, CKMB, CKMBINDEX, TROPONINI in the last 168 hours.  Lipid Panel: Recent Labs  Lab 07/16/19 0500  CHOL 168  TRIG 50  HDL 63  CHOLHDL 2.7  VLDL 10  LDLCALC 95    CBG: Recent Labs  Lab 07/15/19 2122 07/15/19 2207  GLUCAP 66* 83    Microbiology: Results for orders placed or performed during the hospital encounter of 07/15/19  SARS CORONAVIRUS 2 (TAT 6-24 HRS) Nasopharyngeal Nasopharyngeal Swab     Status: None   Collection Time: 07/16/19 12:31 AM   Specimen: Nasopharyngeal Swab  Result Value Ref Range Status   SARS Coronavirus 2 NEGATIVE NEGATIVE Final    Comment: (NOTE) SARS-CoV-2 target nucleic acids are NOT DETECTED. The SARS-CoV-2 RNA is generally detectable in upper and lower respiratory specimens  during the acute phase of infection. Negative results do not preclude SARS-CoV-2 infection, do not rule out co-infections with other pathogens, and should not be used as the sole basis for treatment or other patient management decisions. Negative results must be combined with clinical observations, patient history, and epidemiological information. The expected result is Negative. Fact Sheet for Patients: HairSlick.no Fact Sheet for Healthcare Providers: quierodirigir.com This test is not yet approved or cleared by the [place] FDA and  has been authorized for detection and/or diagnosis of SARS-CoV-2 by FDA under an Emergency Use Authorization (EUA). This EUA will remain  in  effect (meaning this test can be used) for the duration of the COVID-19 declaration under Section 56 4(b)(1) of the Act, 21 U.S.C. section 360bbb-3(b)(1), unless the authorization is terminated or revoked sooner. Performed at [facility], [address] [place], [place] [address]     Coagulation Studies: Recent Labs    07/15/19 01/09/26  LABPROT 13.1  INR 1.0    Imaging: Mr Brain Wo Contrast  Result Date: 07/16/2019 CLINICAL DATA:  Hemiplegia. Left facial droop and slurred speech. Headache. EXAM: MRI HEAD WITHOUT CONTRAST TECHNIQUE: Multiplanar, multiecho pulse sequences of the brain and surrounding structures were obtained without intravenous contrast. COMPARISON:  CT a head and neck 07/15/2019 FINDINGS: Brain: Negative for acute infarct. Ventricle size and cerebral volume normal. Scattered white matter lesions. Combination of periventricular and deep white matter hyperintensities. Hyperintensity also in the right posterior external capsule. Brainstem and cerebellum intact. Negative for hemorrhage or mass. Vascular: Normal arterial flow voids Skull and upper cervical spine: Negative Sinuses/Orbits: Mild mucosal edema paranasal sinuses.  Normal orbit Other: None IMPRESSION: Negative for acute infarct Scattered periventricular deep white matter hyperintensities bilaterally, suspicious for multiple sclerosis. Other possibilities include chronic ischemia, vasculitis, complex migraine headaches Electronically Signed   By: Marlan Palau M.D.   On: 07/16/2019 12:12   Mr Brain W Contrast  Result Date: 07/17/2019 CLINICAL DATA:  Muscle weakness.  Rule out multiple sclerosis. EXAM: MRI HEAD WITH CONTRAST TECHNIQUE: Multiplanar, multiecho pulse sequences of the brain and surrounding structures were obtained with intravenous contrast. CONTRAST:  52mL GADAVIST GADOBUTROL 1 MMOL/ML IV SOLN COMPARISON:  Unenhanced MRI head 07/16/2019 FINDINGS: Multiple white matter lesions are present bilaterally.  Left occipital and temporal periventricular white matter lesions. Right frontal periventricular and deep white matter hyperintensities. Negative brainstem. Ventricle size normal. Normal enhancement.  No enhancing lesions identified. IMPRESSION: Bilateral white matter lesions, suspicious for multiple sclerosis. No enhancing lesions present. Electronically Signed   By: Marlan Palau M.D.   On: 07/17/2019 12:54    Medications:  I have reviewed the patient's current medications. Scheduled: . aspirin  300 mg Rectal Daily   Or  . aspirin EC  325 mg Oral Daily  . atorvastatin  10 mg Oral q1800  . enoxaparin (LOVENOX) injection  40 mg Subcutaneous Q24H  . levETIRAcetam  500 mg Oral BID  . pantoprazole  40 mg Oral Daily  . sodium chloride flush  3 mL Intravenous Q12H    Assessment/Plan: No new neurological complaints.  Patient day #2 Solumedrol  Recommendations: 1. Patient to receive 3 days Solumedrol.  Showing improvement.  If remains stable may be discharged after third dose.  Steroid taper not necessary.   2. Patient to follow up with neurology on an outpatient basis.     LOS: 3 days   Thana Farr, MD Neurology [phone] 07/18/2019  9:56 AM

## 2019-07-18 NOTE — Progress Notes (Addendum)
Physical Therapy Treatment Patient Details Name: Christie Oconnor MRN: [id number] DOB: [birth year] Today's Date: 07/18/2019    History of Present Illness Pt admitted for possible CVA, MRI (-) for acute infarct, however showed "Scattered periventricular deep white matter hyperintensities bilaterally". History of tobacco and marijuana use. Pt with complaints of L side weakness and AMS along with jerking episode.    PT Comments    Pt feeling better today.  More engaged and excited about participation today.  Stated she walked last night in hallway and has been walking some to bathroom on her own.  Out of bed without assist.  She is able to walk to PT gym with RW and supervision.  Seated rest before stair training with B rails.  While she will have +2 assist to support her going in, she would benefit from continued stair training for alternate techniques on next session.  Pt stated she anticipates discharge tomorrow.  Progressing well with mobility and confidence.  Ne diagnosis MS vs CVA.  Encouraged pt to follow up with OPPT for continued education and support.   Follow Up Recommendations  Outpatient PT;Supervision for mobility/OOB     Equipment Recommendations  Rolling walker with 5" wheels    Recommendations for Other Services       Precautions / Restrictions Precautions Precautions: Fall Restrictions Weight Bearing Restrictions: No    Mobility  Bed Mobility Overal bed mobility: Independent Bed Mobility: Supine to Sit;Sit to Supine     Supine to sit: Independent Sit to supine: Independent      Transfers Overall transfer level: Modified independent Equipment used: Rolling walker (2 wheeled) Transfers: Sit to/from Stand Sit to Stand: Modified independent (Device/Increase time)            Ambulation/Gait Ambulation/Gait assistance: Supervision Gait Distance (Feet): 230 Feet Assistive device: Rolling walker (2 wheeled) Gait Pattern/deviations: Step-through  pattern Gait velocity: decreased   General Gait Details: 200' x 1, then 230' after seated rest before stairs   Stairs Stairs: Yes Stairs assistance: Supervision Stair Management: Two rails;Step to pattern Number of Stairs: 8 General stair comments: did well with rails.  no rails at home (rental home) will need further education for alternate techniques   Wheelchair Mobility    Modified Rankin (Stroke Patients Only)       Balance Overall balance assessment: Needs assistance Sitting-balance support: Feet supported Sitting balance-Leahy Scale: Normal     Standing balance support: Bilateral upper extremity supported Standing balance-Leahy Scale: Good Standing balance comment: improved today and more confident without "UE support statically                            Cognition Arousal/Alertness: Awake/alert Behavior During Therapy: WFL for tasks assessed/performed Overall Cognitive Status: Within Functional Limits for tasks assessed                                        Exercises      General Comments        Pertinent Vitals/Pain Pain Assessment: Faces Faces Pain Scale: Hurts a little bit Pain Location: L hip - tightness Pain Descriptors / Indicators: Aching Pain Intervention(s): Limited activity within patient's tolerance    Home Living                      Prior Function  PT Goals (current goals can now be found in the care plan section) Progress towards PT goals: Progressing toward goals    Frequency    7X/week      PT Plan Current plan remains appropriate    Co-evaluation              AM-PAC PT "6 Clicks" Mobility   Outcome Measure  Help needed turning from your back to your side while in a flat bed without using bedrails?: None Help needed moving from lying on your back to sitting on the side of a flat bed without using bedrails?: None Help needed moving to and from a bed to a chair  (including a wheelchair)?: None Help needed standing up from a chair using your arms (e.g., wheelchair or bedside chair)?: None Help needed to walk in hospital room?: A Little Help needed climbing 3-5 steps with a railing? : A Little 6 Click Score: 22    End of Session Equipment Utilized During Treatment: Gait belt Activity Tolerance: Patient tolerated treatment well Patient left: in bed;with call bell/phone within reach;with bed alarm set Nurse Communication: Mobility status Hemiplegia - Right/Left: Left Hemiplegia - dominant/non-dominant: Non-dominant     Time: 7619-5093 PT Time Calculation (min) (ACUTE ONLY): 21 min  Charges:  $Gait Training: 8-22 mins                     Chesley Noon, PTA 07/18/19, 10:46 AM

## 2019-07-18 NOTE — Progress Notes (Signed)
Reports felt much better this morning. This afternoon, reports onset frontal HA and sharp transient intermittent shooting pains in leg. Tylenol given with adequate relief. Solumedrol IV given and tolerated well. More calm/pleasant today. Educated on tobacco and substance cessation with resources discussed.

## 2019-07-19 MED ORDER — ASPIRIN EC 81 MG PO TBEC: 81.0000 mg | DELAYED_RELEASE_TABLET | Freq: Every day | ORAL | 0 refills | Status: AC

## 2019-07-19 MED ORDER — LEVETIRACETAM 250 MG PO TABS: 250.0000 mg | ORAL_TABLET | Freq: Two times a day (BID) | ORAL | 0 refills | Status: DC

## 2019-07-19 NOTE — Progress Notes (Signed)
Pt d/c to home via friend. Education completed. IV removed intact. VSS. All belongings sent with pt.

## 2019-07-19 NOTE — Progress Notes (Signed)
Occupational Therapy Treatment Patient Details Name: Christie Oconnor MRN: [id number] DOB: May 25, 1986 Today's Date: 07/19/2019    History of present illness Pt admitted for possible CVA, MRI (-) for acute infarct, however showed "Scattered periventricular deep white matter hyperintensities bilaterally". History of tobacco and marijuana use. Pt with complaints of L side weakness and AMS along with jerking episode.   OT comments  Ms. Wilcher was seen for OT treatment on this date. Upon arrival to room pt awake/alert, semi-supine in bed and agreeable to OT tx. Pt c/o 4/10 "right sided" headache this date with RN in room to administer medication. OT engages pt in discussion about ADL and IADL management upon hospital DC. Pt and provider problem solved strategies to support pt functional independence and satisfaction with daily routines including BADLs, caring for her children, and home management. Afterward, OT engages pt in dressing and grooming ADLs (see ADL section below for detail) Pt return demonstrates understanding of education provided on compensatory dressing strategies and safety education during ADL mgt. Pt continues to benefit from skilled OT services to maximize return to PLOF and minimize risk of future falls, injury, caregiver burden, and readmission. Will continue to follow POC. Discharge recommendation remains appropriate.    Follow Up Recommendations  Home health OT    Equipment Recommendations  Tub/shower seat    Recommendations for Other Services      Precautions / Restrictions Precautions Precautions: Fall Restrictions Weight Bearing Restrictions: No       Mobility Bed Mobility Overal bed mobility: Needs Assistance Bed Mobility: Supine to Sit;Sit to Supine     Supine to sit: HOB elevated;Modified independent (Device/Increase time) Sit to supine: Modified independent (Device/Increase time);HOB elevated   General bed mobility comments: Pt able to perform bed  mobility tasks without significant assist from bedrails  Transfers Overall transfer level: Needs assistance Equipment used: Rolling walker (2 wheeled) Transfers: Sit to/from Stand Sit to Stand: Supervision         General transfer comment: Pt able to perform transfers without physical assist. Ambulates in room using RW. Steady standing at sink during functional activities w/o an AD.    Balance Overall balance assessment: Needs assistance Sitting-balance support: Feet supported Sitting balance-Leahy Scale: Good     Standing balance support: During functional activity;No upper extremity supported;Single extremity supported Standing balance-Leahy Scale: Good Standing balance comment: Pt able to amb with bUE supported from RW                           ADL either performed or assessed with clinical judgement   ADL Overall ADL's : Needs assistance/impaired Eating/Feeding: Independent   Grooming: Supervision/safety;Standing;Set up Grooming Details (indicate cue type and reason): Pt demonstrates improved activity tolerance, standing balance, and LUE FMC during standing grooming tasks at sink this date. She is able to complete oral care, face washing, and limited UB bathing (washing tape residue off of arms) while standing for >10 minutes at her room sink. Supervision and minimal set-up provided from this therapist.         Upper Body Dressing : Set up;Supervision/safety;Standing Upper Body Dressing Details (indicate cue type and reason): Pt dons shirt while standing at EOB this date. No LOB noted. Lower Body Dressing: Set up;Supervision/safety;Sit to/from stand Lower Body Dressing Details (indicate cue type and reason): Pt able to don socks and pants while seated EOB this date. OT provides supervision for safety as well as min cues for compensatory dressing techniques such  as donning clothing over her L side first to ensure most flexibility with the garment when dressing her  weaker side.             Functional mobility during ADLs: Supervision/safety;Rolling walker       Vision Baseline Vision/History: No visual deficits     Perception     Praxis      Cognition Arousal/Alertness: Awake/alert Behavior During Therapy: WFL for tasks assessed/performed Overall Cognitive Status: Within Functional Limits for tasks assessed                                          Exercises Other Exercises Other Exercises: Pt and provider problem solve strategies to support satisfaction and functional independence during daily routines/activities upon hospital DC. Pt feels confident in level of support from friends and family members as well as home health services to supplement for safety and continued therapy. Other Exercises: OT engages pt in dressing and grooming tasks this date. OT provided set-up/supervision for safety and educates pt on compensatory dressing strategies. Pt return demonstrated understanding of education provided.   Shoulder Instructions       General Comments      Pertinent Vitals/ Pain       Pain Assessment: No/denies pain Pain Score: 4  Pain Location: R sided headache Pain Descriptors / Indicators: Headache Pain Intervention(s): Limited activity within patient's tolerance;Monitored during session;RN gave pain meds during session  Home Living                                          Prior Functioning/Environment              Frequency  Min 2X/week        Progress Toward Goals  OT Goals(current goals can now be found in the care plan section)  Progress towards OT goals: Progressing toward goals  Acute Rehab OT Goals Patient Stated Goal: 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 OT Goal Formulation: With patient Time For Goal Achievement: 07/30/19 Potential to Achieve Goals: Good  Plan Discharge plan remains appropriate;Frequency remains appropriate    Co-evaluation                 AM-PAC  OT "6 Clicks" Daily Activity     Outcome Measure   Help from another person eating meals?: A Little Help from another person taking care of personal grooming?: A Little Help from another person toileting, which includes using toliet, bedpan, or urinal?: A Little Help from another person bathing (including washing, rinsing, drying)?: A Little Help from another person to put on and taking off regular upper body clothing?: A Little Help from another person to put on and taking off regular lower body clothing?: A Little 6 Click Score: 18    End of Session Equipment Utilized During Treatment: Gait belt;Rolling walker  OT Visit Diagnosis: Other abnormalities of gait and mobility (R26.89);Hemiplegia and hemiparesis Hemiplegia - Right/Left: Left Hemiplegia - dominant/non-dominant: Non-Dominant Hemiplegia - caused by: Unspecified   Activity Tolerance Patient tolerated treatment well   Patient Left in bed;with call bell/phone within reach;with bed alarm set;with family/visitor present   Nurse Communication          Time: 1457-1530 OT Time Calculation (min): 33 min  Charges: OT General Charges $OT Visit:  1 Visit OT Treatments $Self Care/Home Management : 23-37 mins  Shara Blazing, M.S., OTR/L Ascom: [phone] 07/19/19, 4:51 PM

## 2019-07-19 NOTE — Discharge Summary (Signed)
[place] [facility] [place] NAME: Christie Oconnor    MR#:  [id number]  DATE OF BIRTH:  1985/11/22  DATE OF ADMISSION:  07/15/2019   ADMITTING PHYSICIAN: Christel Mormon, MD  DATE OF DISCHARGE: 07/19/2019  PRIMARY CARE PHYSICIAN: Patient, No Pcp Per   ADMISSION DIAGNOSIS:  CVA (cerebral vascular accident) ([place]) [I63.9] Left-sided weakness [R53.1] DISCHARGE DIAGNOSIS:  Active Problems:   CVA (cerebral vascular accident) ([place])  SECONDARY DIAGNOSIS:  History reviewed. No pertinent past medical history. HOSPITAL COURSE:  Chief complaint; left-sided weakness and altered mental status  History of presenting complaint; Christie Oconnor  is a 33 y.o. African-American female with a known history of ongoing tobacco abuse as well as marijuana abuse, presented to [facility] left-sided weakness with associated left facial droop and slurred speech as well as headache with mild nausea without vomiting started about 7:30 PM she had associated mild dizziness and blurred vision.  By the time she got to the ambulance she was noted to have shaking of the left arm and suspected seizure. Labs revealed normal blood glucose and otherwise normal labs.  She unremarkable and the neck CTA.  EKG showed no sinus rhythm with a rate of 78.  Patient was admitted for further evaluation and management  Hospital course; Newly diagnosed multiple sclerosis  Patient presented with left-sided hemiparesis; more on the left lower extremity. MRI of the brain done with no acute infarct but revealed scattered periventricular deep white matter hyperintensities suspicious for multiple sclerosis.  Patient evaluated by neurologist and subsequently had MRI of the brain with contrast which revealed bilateral white matter lesions, suspicious for multiple sclerosis.No enhancing lesions present. Neurologist has initiated high-dose IV Solu-Medrol yesterday at 1 g daily x3 days.  Added  Protonix during this admission due to patient being on high-dose IV steroids. Patient reevaluated by physical therapist and recommended outpatient physical therapy with supervision for mobility and out of bed.  Neurologist has given clearance to discharge patient after patient completes the 3 days of IV Solu-Medrol today .  Patient reports significant improvement in the left-sided weakness with treatment with steroids.  Patient stated neurologist told her she would make arrangement for outpatient follow-up.  Patient currently has no primary care physician.  I discussed with nursing staff who discussed with case manager.  Patient has Medicaid.  Patient to call Medicaid to confirm who her assigned primary care physician is in follow-up on discharge. Seizure-like activity-no more seizures.  Currently on Keppra.  Continue the same until patient follows up with neurology as outpatient.  Had normal EEG during this admission. Tobacco use -Tobacco cessation counseling performed this admission Marijuana use -Substance cessation counseling performed  DISCHARGE CONDITIONS:  Stable CONSULTS OBTAINED:  Treatment Team:  Alexis Goodell, MD DRUG ALLERGIES:  No Known Allergies DISCHARGE MEDICATIONS:   Allergies as of 07/19/2019   No Known Allergies     Medication List    TAKE these medications   aspirin EC 81 MG tablet Take 1 tablet (81 mg total) by mouth daily.   levETIRAcetam 250 MG tablet Commonly known as: KEPPRA Take 1 tablet (250 mg total) by mouth 2 (two) times daily.            Durable Medical Equipment  (From admission, onward)         Start     Ordered   07/19/19 1050  For home use only DME 4 wheeled rolling walker with seat  (Walkers)  Once  Question:  Patient needs a walker to treat with the following condition  Answer:  Multiple sclerosis (HCC)   07/19/19 1051           DISCHARGE INSTRUCTIONS:   DIET:  Regular diet DISCHARGE CONDITION:  Stable ACTIVITY:    Activity as tolerated OXYGEN:  Home Oxygen: No.  Oxygen Delivery: room air DISCHARGE LOCATION:  home   If you experience worsening of your admission symptoms, develop shortness of breath, life threatening emergency, suicidal or homicidal thoughts you must seek medical attention immediately by calling 911 or calling your MD immediately  if symptoms less severe.  You Must read complete instructions/literature along with all the possible adverse reactions/side effects for all the Medicines you take and that have been prescribed to you. Take any new Medicines after you have completely understood and accpet all the possible adverse reactions/side effects.   Please note  You were cared for by a hospitalist during your hospital stay. If you have any questions about your discharge medications or the care you received while you were in the hospital after you are discharged, you can call the unit and asked to speak with the hospitalist on call if the hospitalist that took care of you is not available. Once you are discharged, your primary care physician will handle any further medical issues. Please note that NO REFILLS for any discharge medications will be authorized once you are discharged, as it is imperative that you return to your primary care physician (or establish a relationship with a primary care physician if you do not have one) for your aftercare needs so that they can reassess your need for medications and monitor your lab values.    On the day of Discharge:  VITAL SIGNS:  Blood pressure 114/73, pulse (!) 59, temperature 98.4 F (36.9 C), temperature source Oral, resp. rate 18, height 5\' 9"  (1.753 m), weight 79.7 kg, SpO2 100 %. PHYSICAL EXAMINATION:  GENERAL:  33 y.o.-year-old patient lying in the bed with no acute distress.  EYES: Pupils equal, round, reactive to light and accommodation. No scleral icterus. Extraocular muscles intact.  HEENT: Head atraumatic, normocephalic. Oropharynx  and nasopharynx clear.  NECK:  Supple, no jugular venous distention. No thyroid enlargement, no tenderness.  LUNGS: Normal breath sounds bilaterally, no wheezing, rales,rhonchi or crepitation. No use of accessory muscles of respiration.  CARDIOVASCULAR: S1, S2 normal. No murmurs, rubs, or gallops.  ABDOMEN: Soft, non-tender, non-distended. Bowel sounds present. No organomegaly or mass.  EXTREMITIES: No pedal edema, cyanosis, or clubbing.  NEUROLOGIC: Cranial nerves II through XII are intact. Muscle strength 5/5 in all extremities. Sensation intact. Gait not checked.  PSYCHIATRIC: The patient is alert and oriented x 3.  SKIN: No obvious rash, lesion, or ulcer.  DATA REVIEW:   CBC Recent Labs  Lab 07/17/19 0603  WBC 7.8  HGB 12.0  HCT 35.2*  PLT 223    Chemistries  Recent Labs  Lab 07/15/19 2127  07/18/19 0551  NA 137   < > 137  K 3.7   < > 3.8  CL 103   < > 102  CO2 21*   < > 23  GLUCOSE 76   < > 139*  BUN 20   < > 18  CREATININE 0.88   < > 0.78  CALCIUM 8.8*   < > 9.7  MG  --   --  1.8  AST 21  --   --   ALT 15  --   --  ALKPHOS 38  --   --   BILITOT 0.7  --   --    < > = values in this interval not displayed.     Microbiology Results  Results for orders placed or performed during the hospital encounter of 07/15/19  SARS CORONAVIRUS 2 (TAT 6-24 HRS) Nasopharyngeal Nasopharyngeal Swab     Status: None   Collection Time: 07/16/19 12:31 AM   Specimen: Nasopharyngeal Swab  Result Value Ref Range Status   SARS Coronavirus 2 NEGATIVE NEGATIVE Final    Comment: (NOTE) SARS-CoV-2 target nucleic acids are NOT DETECTED. The SARS-CoV-2 RNA is generally detectable in upper and lower respiratory specimens during the acute phase of infection. Negative results do not preclude SARS-CoV-2 infection, do not rule out co-infections with other pathogens, and should not be used as the sole basis for treatment or other patient management decisions. Negative results must be combined  with clinical observations, patient history, and epidemiological information. The expected result is Negative. Fact Sheet for Patients: HairSlick.no Fact Sheet for Healthcare Providers: quierodirigir.com This test is not yet approved or cleared by the [place] FDA and  has been authorized for detection and/or diagnosis of SARS-CoV-2 by FDA under an Emergency Use Authorization (EUA). This EUA will remain  in effect (meaning this test can be used) for the duration of the COVID-19 declaration under Section 56 4(b)(1) of the Act, 21 U.S.C. section 360bbb-3(b)(1), unless the authorization is terminated or revoked sooner. Performed at [facility], [address], [place], [place] [address]     RADIOLOGY:  No results found.   Management plans discussed with the patient, family and they are in agreement.  CODE STATUS: Full Code   TOTAL TIME TAKING CARE OF THIS PATIENT: 37 minutes.    Declynn Lopresti M.D on 07/19/2019 at 10:52 AM  Between 7am to 6pm - Pager - [phone]  After 6pm go to [email or web address] - Social research officer, government  [facility]  Office  [phone]  CC: Primary care physician; Patient, No Pcp Per   Note: This dictation was prepared with Dragon dictation along with smaller phrase technology. Any transcriptional errors that result from this process are unintentional.

## 2019-07-19 NOTE — TOC Transition Note (Signed)
Transition of Care [facility]) - CM/SW Discharge Note   Patient Details  Name: Christie Oconnor MRN: [id number] Date of Birth: 01/23/1986  [facility] CM/SW Contact:  Shelbie Hutching, RN Phone Number: 07/19/2019, 3:28 PM   Clinical Narrative:    Patient discharging home.  Rolling walker brought up to the patient's room.     Final next level of care: Home/Self Care Barriers to Discharge: Barriers Resolved   Patient Goals and CMS Choice Patient states their goals for this hospitalization and ongoing recovery are:: To get better CMS Medicare.gov Compare Post Acute Care list provided to:: Patient    Discharge Placement                       Discharge Plan and Services   Discharge Planning Services: CM Consult            DME Arranged: Gilford Rile rolling DME Agency: [employer] Date DME Agency Contacted: 07/19/19 Time DME Agency Contacted: 1528 Representative spoke with at DME Agency: [place] Determinants of Health ([place]) Interventions     Readmission Risk Interventions No flowsheet data found.

## 2019-07-19 NOTE — Progress Notes (Signed)
Physical Therapy Treatment Patient Details Name: Christie Oconnor MRN: [id number] DOB: 09-Jul-1986 Today's Date: 07/19/2019    History of Present Illness Pt admitted for possible CVA, MRI (-) for acute infarct, however showed "Scattered periventricular deep white matter hyperintensities bilaterally". History of tobacco and marijuana use. Pt with complaints of L side weakness and AMS along with jerking episode.    PT Comments    Pt is progressing toward goals. Upon entry, pt is resting in bed. This date, pt performs bed mobility, transfers, and ambulation with supervision/CGA. Pt will continue to benefit from skilled PT to address deficits in strength, mobility, and coordination. Session today was focused on increasing comfort with stairs; pt able to perform safely and is confident in technique for her home situation. Follow-up care plan has been updated to HHPT to address above deficits. Pt has experienced significant improvement since evaluation, but is still not functioning at baseline.      Follow Up Recommendations  Home health PT     Equipment Recommendations  Rolling walker with 5" wheels    Recommendations for Other Services       Precautions / Restrictions Precautions Precautions: Fall Restrictions Weight Bearing Restrictions: No    Mobility  Bed Mobility Overal bed mobility: Needs Assistance Bed Mobility: Supine to Sit;Sit to Supine     Supine to sit: Supervision Sit to supine: Supervision   General bed mobility comments: Pt able to perform bed mobility tasks without significant assist from bedrails  Transfers Overall transfer level: Needs assistance Equipment used: Rolling walker (2 wheeled) Transfers: Sit to/from Stand Sit to Stand: Supervision         General transfer comment: Pt able to perform transfers without physical assist in reasonable amount of time  Ambulation/Gait Ambulation/Gait assistance: Supervision Gait Distance (Feet): 200  Feet Assistive device: Rolling walker (2 wheeled) Gait Pattern/deviations: Step-through pattern     General Gait Details: Pt amb using RW with step through gait pattern. Pt doesn't require significant cues to amb. Still displays L side weakness when amb   Stairs Stairs: Yes Stairs assistance: Min guard Stair Management: Forwards;One rail Right;One rail Left Number of Stairs: 8 General stair comments: Able to perform with rail on one side and handheld assist on the other to make more applicable to home environment   Wheelchair Mobility    Modified Rankin (Stroke Patients Only)       Balance Overall balance assessment: Needs assistance Sitting-balance support: Feet supported Sitting balance-Leahy Scale: Good     Standing balance support: Bilateral upper extremity supported Standing balance-Leahy Scale: Good Standing balance comment: Pt able to amb with bUE supported from RW                            Cognition Arousal/Alertness: Awake/alert Behavior During Therapy: [facility] for tasks assessed/performed Overall Cognitive Status: Within Functional Limits for tasks assessed                                        Exercises      General Comments        Pertinent Vitals/Pain Pain Assessment: No/denies pain    Home Living                      Prior Function            PT Goals (  current goals can now be found in the care plan section) Acute Rehab PT Goals Patient Stated Goal: 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 PT Goal Formulation: With patient Time For Goal Achievement: 07/30/19 Potential to Achieve Goals: Good Progress towards PT goals: Progressing toward goals    Frequency    7X/week      PT Plan Current plan remains appropriate    Co-evaluation              AM-PAC PT "6 Clicks" Mobility   Outcome Measure  Help needed turning from your back to your side while in a flat bed without using bedrails?: None Help needed  moving from lying on your back to sitting on the side of a flat bed without using bedrails?: None Help needed moving to and from a bed to a chair (including a wheelchair)?: A Little Help needed standing up from a chair using your arms (e.g., wheelchair or bedside chair)?: A Little Help needed to walk in hospital room?: A Little Help needed climbing 3-5 steps with a railing? : A Lot 6 Click Score: 19    End of Session Equipment Utilized During Treatment: Gait belt Activity Tolerance: Patient tolerated treatment well Patient left: in bed;with call bell/phone within reach;with bed alarm set Nurse Communication: Mobility status PT Visit Diagnosis: Unsteadiness on feet (R26.81);Muscle weakness (generalized) (M62.81);Difficulty in walking, not elsewhere classified (R26.2);Hemiplegia and hemiparesis Hemiplegia - Right/Left: Left Hemiplegia - dominant/non-dominant: Non-dominant Hemiplegia - caused by: Unspecified     Time: 4098-1191 PT Time Calculation (min) (ACUTE ONLY): 24 min  Charges:                        Gaspar Garbe, SPT    Juleah Paradise 07/19/2019, 1:53 PM

## 2019-07-19 NOTE — Progress Notes (Signed)
Inpatient Rehab Admissions Coordinator:   Note OT recommendations for home health and SLP reporting no needs. Pt would require at least 2 disciplines to be considered for CIR. Pt will not need a CIR consult at this time.    Shann Medal, PT, DPT Admissions Coordinator [phone] 07/19/19  11:18 AM

## 2019-09-03 ENCOUNTER — Other Ambulatory Visit: Payer: Self-pay | Admitting: Neurology

## 2019-09-03 DIAGNOSIS — G35 Multiple sclerosis: Secondary | ICD-10-CM

## 2019-09-10 ENCOUNTER — Other Ambulatory Visit: Payer: Self-pay

## 2019-09-10 ENCOUNTER — Other Ambulatory Visit: Payer: Self-pay | Admitting: Diagnostic Radiology

## 2019-09-10 ENCOUNTER — Ambulatory Visit
Admission: RE | Admit: 2019-09-10 | Discharge: 2019-09-10 | Disposition: A | Discharge status: Home / Self Care | Payer: Medicaid Other | Source: Ambulatory Visit | Attending: Neurology | Admitting: Neurology

## 2019-09-10 DIAGNOSIS — G35 Multiple sclerosis: Secondary | ICD-10-CM | POA: Diagnosis present

## 2019-09-10 LAB — PROTEIN, CSF: Total  Protein, CSF: 20 mg/dL (ref 15–45)

## 2019-09-10 LAB — APTT: aPTT: 29 seconds (ref 24–36)

## 2019-09-10 LAB — CSF CELL COUNT WITH DIFFERENTIAL
Eosinophils, CSF: 0 %
Lymphs, CSF: 89 %
Monocyte-Macrophage-Spinal Fluid: 9 %
RBC Count, CSF: 79 /mm3 — ABNORMAL HIGH (ref 0–3)
Segmented Neutrophils-CSF: 2 %
Tube #: 3
WBC, CSF: 7 /mm3 — ABNORMAL HIGH (ref 0–5)

## 2019-09-10 LAB — CBC WITH DIFFERENTIAL/PLATELET
Abs Immature Granulocytes: 0.05 10*3/uL (ref 0.00–0.07)
Basophils Absolute: 0 10*3/uL (ref 0.0–0.1)
Basophils Relative: 0 %
Eosinophils Absolute: 0.2 10*3/uL (ref 0.0–0.5)
Eosinophils Relative: 2 %
HCT: 38 % (ref 36.0–46.0)
Hemoglobin: 12.9 g/dL (ref 12.0–15.0)
Immature Granulocytes: 1 %
Lymphocytes Relative: 17 %
Lymphs Abs: 1.8 10*3/uL (ref 0.7–4.0)
MCH: 32.8 pg (ref 26.0–34.0)
MCHC: 33.9 g/dL (ref 30.0–36.0)
MCV: 96.7 fL (ref 80.0–100.0)
Monocytes Absolute: 0.6 10*3/uL (ref 0.1–1.0)
Monocytes Relative: 6 %
Neutro Abs: 8 10*3/uL — ABNORMAL HIGH (ref 1.7–7.7)
Neutrophils Relative %: 74 %
Platelets: 253 10*3/uL (ref 150–400)
RBC: 3.93 MIL/uL (ref 3.87–5.11)
RDW: 13.5 % (ref 11.5–15.5)
WBC: 10.7 10*3/uL — ABNORMAL HIGH (ref 4.0–10.5)
nRBC: 0 % (ref 0.0–0.2)

## 2019-09-10 LAB — PROTIME-INR
INR: 0.9 (ref 0.8–1.2)
Prothrombin Time: 12.3 seconds (ref 11.4–15.2)

## 2019-09-10 LAB — GLUCOSE, CSF: Glucose, CSF: 55 mg/dL (ref 40–70)

## 2019-09-10 LAB — POCT PREGNANCY, URINE: Preg Test, Ur: NEGATIVE

## 2019-09-10 NOTE — OR Nursing (Signed)
MD at [facility] for pt. Evaluation. He left after assessment and advised pt. Was clear to leave after 4 hours flat.

## 2019-09-10 NOTE — OR Nursing (Signed)
Pt. Departing SDS for X-ray. Pt. In NAD with VSS and awake.

## 2019-09-10 NOTE — Progress Notes (Unsigned)
Pt stable after lp.Back stable.D/C instructions given F/U with her MD. 

## 2019-09-10 NOTE — OR Nursing (Signed)
Pt. returned to room post LP procedure. Pt. Appears sleepy, however oriented. Pt. Awake and in NAD. VSS and pt. Remains supine.

## 2019-09-14 LAB — OLIGOCLONAL BANDS, CSF + SERM

## 2019-09-14 LAB — CSF CULTURE W GRAM STAIN: Culture: NO GROWTH

## 2019-09-16 LAB — HSV(HERPES SMPLX VRS)ABS-I+II(IGG)-CSF: HSV Type I/II Ab, IgG CSF: <0.34 IV (ref <=0.89)

## 2019-09-27 ENCOUNTER — Other Ambulatory Visit: Payer: Self-pay | Admitting: Neurology

## 2019-09-27 DIAGNOSIS — G35 Multiple sclerosis: Secondary | ICD-10-CM

## 2019-10-04 ENCOUNTER — Ambulatory Visit
Admission: RE | Admit: 2019-10-04 | Discharge: 2019-10-04 | Disposition: A | Discharge status: Home / Self Care | Payer: Medicaid Other | Source: Ambulatory Visit | Attending: Neurology | Admitting: Neurology

## 2019-10-04 ENCOUNTER — Other Ambulatory Visit: Payer: Self-pay

## 2019-10-04 DIAGNOSIS — G35 Multiple sclerosis: Secondary | ICD-10-CM

## 2019-10-04 MED ORDER — GADOBUTROL 1 MMOL/ML IV SOLN
8.0000 mL | Freq: Once | INTRAVENOUS | Status: AC | PRN
  Administered 2019-10-04: 7.5 mL via INTRAVENOUS

## 2019-12-22 ENCOUNTER — Telehealth: Payer: Self-pay | Admitting: *Deleted

## 2019-12-22 ENCOUNTER — Other Ambulatory Visit: Payer: Self-pay

## 2019-12-22 ENCOUNTER — Encounter: Payer: Self-pay | Admitting: Neurology

## 2019-12-22 ENCOUNTER — Ambulatory Visit: Payer: Medicaid Other | Admitting: Neurology

## 2019-12-22 VITALS — BP 112/77 | HR 65 | Temp 97.7°F | Ht 67.0 in | Wt 182.6 lb

## 2019-12-22 DIAGNOSIS — R3915 Urgency of urination: Secondary | ICD-10-CM

## 2019-12-22 DIAGNOSIS — R200 Anesthesia of skin: Secondary | ICD-10-CM

## 2019-12-22 DIAGNOSIS — R519 Headache, unspecified: Secondary | ICD-10-CM | POA: Insufficient documentation

## 2019-12-22 DIAGNOSIS — G35 Multiple sclerosis: Secondary | ICD-10-CM | POA: Diagnosis not present

## 2019-12-22 DIAGNOSIS — R269 Unspecified abnormalities of gait and mobility: Secondary | ICD-10-CM

## 2019-12-22 MED ORDER — IMIPRAMINE HCL 25 MG PO TABS: 25.0000 mg | ORAL_TABLET | Freq: Every day | ORAL | 5 refills | Status: DC

## 2019-12-22 MED ORDER — GABAPENTIN 300 MG PO CAPS: ORAL_CAPSULE | ORAL | 11 refills | Status: DC

## 2019-12-22 MED ORDER — VITAMIN D (ERGOCALCIFEROL) 1.25 MG (50000 UNIT) PO CAPS: 50000.0000 [IU] | ORAL_CAPSULE | ORAL | 4 refills | Status: AC

## 2019-12-22 MED ORDER — ARMODAFINIL 200 MG PO TABS: ORAL_TABLET | ORAL | 5 refills | Status: DC

## 2019-12-22 NOTE — Telephone Encounter (Signed)
Faxed complete/signed Tysabri start form to [place] touch prescribing program at [phone]. Received fax confirmation. Waiting on pt to be enrolled in touch program and then I will fax order to pt care center for them to get her scheduled for the infusion.  Called Medicaid/nctracks at [phone] and spoke with Lupita Leash. Verified no PA required for Tysabri. Covered by plan. Interaction ID# for call is [id number].

## 2019-12-22 NOTE — Progress Notes (Signed)
[facility]  PATIENT: Christie Oconnor DOB: 1986/03/06  REFERRING DOCTOR OR PCP: [facility] SOURCE: Patient, notes from Dr. Malvin Johns.  MRI laboratory reports reviewed.  MRI images personally reviewed. _________________________________   HISTORICAL  CHIEF COMPLAINT:  Chief Complaint  Patient presents with  . New Patient (Initial Visit)    RM 13, alone. Paper referral from Dr. Malvin Johns for MS. She is trying to get initiated on Tysabri q 6 wks. Has not been on therapy yet. 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 I called [facility] and spoke with staff. They verified JCV drawn 09/02/19 indeterminate, index: 0.29. Inhibition assay:     HISTORY OF PRESENT ILLNESS:  I had the pleasure of seeing your patient, Christie Oconnor, at the MS center at [facility] for neurologic consultation regarding her multiple sclerosis.  She is a 34 year old woman who had the onset of left chest numbness that started in October 2020.  As the day went on, she had tingling in the left arm which built up over the day.    In the past she had had similar episodes that were less intense and only lasting for minutes.   When symptoms persisted, she went to [facility] ED.   She had MRI of the brain showing lesions in the hemispheres.    She was admitted for IV Steroids x 4 or 5 days.   She was referrred to Dr. Malvin Johns who ordered a LP.    CSF showed oligoclonal bands.    She had MRI of the cervical spine showing several lesions in the spinal cord but no enhancement.    Tysabri was discussed with her but she has not started yet.      Currently, she is doing better than in October.  Her balance is still reduced but she has no falls.  Strength has improved in the left arm and leg but she still feels weaker than her right.   Her left hand is numb.   She gets tingling that fluctuates in the entire  left side, including the arm, chest and sometimes the leg,   She has some numbness at times on the right side.     She is on gabapentin but it has not helped the tingling.   Vision is fine.   She has urinary urgency but no incontinence or hesitancy.       She feels fatigued.   She sleeps a lot but wakes up multiple times at night.  She takes a few naps.  She notes more depression.    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  She was placed on nortriptyline with benefit.    She also had possible seizure activity and was on levetiracetam for a while.   EEG and 3 hour video EEG was normal.   The levetiracetam was discontinued.       REVIEW OF SYSTEMS: Constitutional: No fevers, chills, sweats, or change in appetite Eyes: No visual changes, double vision, eye pain Ear, nose and throat: No hearing loss, ear pain, nasal congestion, sore throat Cardiovascular: No chest pain, palpitations Respiratory: No shortness of breath at rest or with exertion.   No wheezes GastrointestinaI: No nausea, vomiting, diarrhea, abdominal pain, fecal incontinence Genitourinary: No dysuria.  She has urinary  urgency and nocturia. Musculoskeletal: No neck pain, back pain Integumentary: No rash, pruritus, skin lesions Neurological: as above Psychiatric: No depression at this time.  No anxiety Endocrine: No palpitations, diaphoresis, change in appetite, change in weigh or increased thirst Hematologic/Lymphatic: No anemia, purpura, petechiae. Allergic/Immunologic: No itchy/runny eyes, nasal congestion, recent allergic reactions, rashes  ALLERGIES: No Known Allergies  HOME MEDICATIONS:  Current Outpatient Medications:  .  aspirin 81 MG EC tablet, Take 81 mg by mouth daily., Disp: , Rfl:  .  gabapentin (NEURONTIN) 100 MG capsule, Take 200 mg by mouth 2 (two) times daily., Disp: , Rfl:  .  ibuprofen (ADVIL) 600 MG  tablet, Take 1 tablet by mouth as needed., Disp: , Rfl:  .  nortriptyline (PAMELOR) 10 MG capsule, Take 10 mg by mouth at bedtime., Disp: , Rfl:   PAST MEDICAL HISTORY: No past medical history on file.  PAST SURGICAL HISTORY: Past Surgical History:  Procedure Laterality Date  . ANKLE SURGERY Left   . CESAREAN SECTION     x 2  . TUBAL LIGATION      FAMILY HISTORY: No family history on file.  SOCIAL HISTORY:  Social History   Socioeconomic History  . Marital status: Single    Spouse name: Not on file  . Number of children: 2  . Years of education: Not on file  . Highest education level: Not on file  Occupational History  . Occupation: Unemployed  Tobacco Use  . Smoking status: Current Every Day Smoker  . Smokeless tobacco: Never Used  . Tobacco comment: 6 per day  Substance and Sexual Activity  . Alcohol use: Yes  . Drug use: Not on file  . Sexual activity: Not on file  Other Topics Concern  . Not on file  Social History Narrative   Lives with family   Caffeine use:  1/2 gallon tea per day, some soda (usually only drinks when she has headache)   Right handed    Social Determinants of Health   Financial Resource Strain:   . Difficulty of Paying Living Expenses:   Food Insecurity:   . Worried About Programme researcher, broadcasting/film/video in the Last Year:   . Barista in the Last Year:   Transportation Needs:   . Freight forwarder (Medical):   Marland Kitchen Lack of Transportation (Non-Medical):   Physical Activity:   . Days of Exercise per Week:   . Minutes of Exercise per Session:   Stress:   . Feeling of Stress :   Social Connections:   . Frequency of Communication with Friends and Family:   . Frequency of Social Gatherings with Friends and Family:   . Attends Religious Services:   . Active Member of Clubs or Organizations:   . Attends Banker Meetings:   Marland Kitchen Marital Status:   Intimate Partner Violence:   . Fear of Current or Ex-Partner:   . Emotionally  Abused:   Marland Kitchen Physically Abused:   . Sexually Abused:      PHYSICAL EXAM  Vitals:   12/22/19 0854  BP: 112/77  Pulse: 65  Temp: 97.7 F (36.5 C)  Weight: 182 lb 9.6 oz (82.8 kg)  Height: 5\' 7"  (1.702 m)    Body mass index is 28.6 kg/m.   Hearing Screening   125Hz  250Hz  500Hz  1000Hz  2000Hz  3000Hz  4000Hz  6000Hz  8000Hz   Right ear:           Left ear:  Visual Acuity Screening   Right eye Left eye Both eyes  Without correction: 20/20 20/20 20/20   With correction:       General: The patient is well-developed and well-nourished and in no acute distress  HEENT:  Head is [place]/AT.  Sclera are anicteric.  Funduscopic exam shows normal optic discs and retinal vessels.  Neck: No carotid bruits are noted.  The neck is nontender.  Cardiovascular: The heart has a regular rate and rhythm with a normal S1 and S2. There were no murmurs, gallops or rubs.    Skin: Extremities are without rash or  edema.  Musculoskeletal:  Back is nontender  Neurologic Exam  Mental status: The patient is alert and oriented x 3 at the time of the examination. The patient has apparent normal recent and remote memory, with an apparently normal attention span and concentration ability.   Speech is normal.  Cranial nerves: Extraocular movements are full. Pupils are equal, round, and reactive to light and accomodation.  Visual fields are full.  Facial symmetry is present. There is good facial sensation to soft touch bilaterally.Facial strength is normal.  Trapezius and sternocleidomastoid strength is normal. No dysarthria is noted.  The tongue is midline, and the patient has symmetric elevation of the soft palate. No obvious hearing deficits are noted.  Motor:  Muscle bulk is normal.   Tone is normal. Strength is  5 / 5 in all 4 extremities, though rapid alternating movements are reduced in the left hand and foot.   Sensory: She has intact sensation to touch and vibration on the right.  She has reduced  sensation to touch, temperature and vibration in the left leg and reduced sensation to touch and temperature in the left arm and left trunk  Coordination: Cerebellar testing reveals good finger-nose-finger and heel-to-shin bilaterally.  Gait and station: Station is normal.   The gait is slightly wide and tandem gait is wide. Romberg is negative.   Reflexes: Deep tendon reflexes are symmetric in the arms but increased in the left leg relative to the right leg with spread at the knees.  There is no ankle clonus.   Plantar responses are flexor.    DIAGNOSTIC DATA (LABS, IMAGING, TESTING) - I reviewed patient records, labs, notes, testing and imaging myself where available.  Lab Results  Component Value Date   WBC 10.7 (H) 09/10/2019   HGB 12.9 09/10/2019   HCT 38.0 09/10/2019   MCV 96.7 09/10/2019   PLT 253 09/10/2019      Component Value Date/Time   NA 137 07/18/2019 0551   NA 137 06/23/2014 1838   K 3.8 07/18/2019 0551   K 3.7 06/23/2014 1838   CL 102 07/18/2019 0551   CL 103 06/23/2014 1838   CO2 23 07/18/2019 0551   CO2 28 06/23/2014 1838   GLUCOSE 139 (H) 07/18/2019 0551   GLUCOSE 59 (L) 06/23/2014 1838   BUN 18 07/18/2019 0551   BUN 12 06/23/2014 1838   CREATININE 0.78 07/18/2019 0551   CREATININE 0.74 06/23/2014 1838   CALCIUM 9.7 07/18/2019 0551   CALCIUM 8.5 06/23/2014 1838   PROT 7.1 07/15/2019 2127   PROT 7.0 07/22/2013 1107   ALBUMIN 3.8 07/15/2019 2127   ALBUMIN 3.5 07/22/2013 1107   AST 21 07/15/2019 2127   AST 21 07/22/2013 1107   ALT 15 07/15/2019 2127   ALT 17 07/22/2013 1107   ALKPHOS 38 07/15/2019 2127   ALKPHOS 51 07/22/2013 1107   BILITOT 0.7 07/15/2019 2127  BILITOT 0.5 07/22/2013 1107   GFRNONAA >60 07/18/2019 0551   GFRNONAA >60 06/23/2014 1838   GFRNONAA >60 07/22/2013 1107   GFRAA >60 07/18/2019 0551   GFRAA >60 06/23/2014 1838   GFRAA >60 07/22/2013 1107   Lab Results  Component Value Date   CHOL 168 07/16/2019   HDL 63 07/16/2019     LDLCALC 95 07/16/2019   TRIG 50 07/16/2019   CHOLHDL 2.7 07/16/2019   Lab Results  Component Value Date   HGBA1C 4.8 07/16/2019      ASSESSMENT AND PLAN  Multiple sclerosis (HCC)  Gait disturbance  Numbness  Frequent headaches  Urinary urgency   In summary, Ms. Politano is a 34 year old woman with a recent diagnosis of multiple sclerosis.  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  Therefore, initiating her first disease modifying therapy with a more efficacious agent is in her best interest.  Tysabri has been recommended to her and I concur that since she is JCV antibody negative this is an excellent choice for her.  We also discussed Ocrevus as an alternative option.  To help her with the dysesthesias, I will increase the gabapentin to 300 mg 3 times a day.  I will also add imipramine to take the place of nortriptyline as it will hopefully help the headaches as well but have an ability to help her nocturia and insomnia.  She is advised to try to stay active and exercise as tolerated.  She will return to see me in 3 months or sooner if there are new or worsening neurologic symptoms.   Daylin Gruszka A. Felecia Shelling, MD, [facility] 0/25/8527, 7:82 AM Certified in Neurology, Clinical Neurophysiology, Sleep Medicine and Neuroimaging  [facility] [address] [place], [place] [address] [phone]

## 2019-12-27 NOTE — Telephone Encounter (Signed)
Called [employer] at [phone] and spoke with Jazmine. They verified they received new start form sent on 12/22/19. They spoke with pt on 12/13/19 who told them next infusion would be 12/22/19. Advised she was a new patient on 12/22/19 with Dr. Epimenio Foot. We are trying to get her authorized. She transferred me to REMS department to get prescriber updated. She is already listed to go to [facility] for infusion. I spoke with Jerrel Ivory. She placed me on hold to speak with Case Manager (Crystal) to update info in touch prescribing program. They will send fax of initial notice of authorization once they update this. Nothing further needed.

## 2019-12-28 NOTE — Telephone Encounter (Signed)
Received fax from [employer] that pt authorized to receive Tysabri at [facility]. PT enrollment number: [id number]. Faxed signed orders to them at [phone]. Received fax confirmation. Asked that they call pt to get her scheduled, provided pt phone number.

## 2019-12-30 NOTE — Telephone Encounter (Signed)
Checked and pt is scheduled for T[facility]abri infusion with [facility] on 01/12/2020 at 8:30am

## 2020-01-12 ENCOUNTER — Ambulatory Visit (HOSPITAL_COMMUNITY)
Admission: RE | Admit: 2020-01-12 | Discharge: 2020-01-12 | Disposition: A | Discharge status: Home / Self Care | Payer: Medicaid Other | Source: Ambulatory Visit | Attending: Internal Medicine | Admitting: Internal Medicine

## 2020-01-12 ENCOUNTER — Telehealth: Payer: Self-pay | Admitting: *Deleted

## 2020-01-12 ENCOUNTER — Other Ambulatory Visit: Payer: Self-pay

## 2020-01-12 DIAGNOSIS — G35 Multiple sclerosis: Secondary | ICD-10-CM | POA: Diagnosis not present

## 2020-01-12 MED ORDER — SODIUM CHLORIDE 0.9 % IV SOLN: INTRAVENOUS | Status: DC | PRN

## 2020-01-12 MED ORDER — SODIUM CHLORIDE 0.9 % IV SOLN
300.0000 mg | Freq: Once | INTRAVENOUS | Status: AC
  Administered 2020-01-12: 300 mg via INTRAVENOUS
  Filled 2020-01-12: qty 15

## 2020-01-12 NOTE — Progress Notes (Signed)
0830 [facility]abri infusion. Pt A&Ox4, ambulatory on arrival. Pt resting in recliner, IV started and NS running at [facility], VSS, awaiting meds from pharmacy. WCTM.   9326 Tysabri infusion started, WCTM.   1012 Pt tolerated infusion, post monitoring being performed. Snack and drink provided, WCTM.   1115 Pt discharged from the [facility], VSS, IV removed without difficulty. RN reviewed discharge paperwork, pt understands without assistance, pt A&Ox4, ambulatory at time discharge.

## 2020-01-12 NOTE — Telephone Encounter (Signed)
Received fax from touch prescribing program that patient re-authorized from 01/12/2020-07/12/2020. Patient enrollment number: [id number]. Account: [facility]. Site auth number: [id number]

## 2020-01-12 NOTE — Discharge Instructions (Signed)
Natalizumab injection What is this medicine? NATALIZUMAB (na ta LIZ you mab) is used to treat relapsing multiple sclerosis. This drug is not a cure. It is also used to treat Crohn's disease. This medicine may be used for other purposes; ask your health care provider or pharmacist if you have questions. COMMON BRAND NAME(S): Tysabri What should I tell my health care provider before I take this medicine? They need to know if you have any of these conditions:  immune system problems  progressive multifocal leukoencephalopathy (PML)  an unusual or allergic reaction to natalizumab, other medicines, foods, dyes, or preservatives  pregnant or trying to get pregnant  breast-feeding How should I use this medicine? This medicine is for infusion into a vein. It is given by a health care professional in a hospital or clinic setting. A special MedGuide will be given to you by the pharmacist with each prescription and refill. Be sure to read this information carefully each time. Talk to your pediatrician regarding the use of this medicine in children. This medicine is not approved for use in children. Overdosage: If you think you have taken too much of this medicine contact a poison control center or emergency room at once. NOTE: This medicine is only for you. Do not share this medicine with others. What if I miss a dose? It is important not to miss your dose. Call your doctor or health care professional if you are unable to keep an appointment. What may interact with this medicine? Do not take this medicine with any of the following medications:  biologic medicines such as adalimumab, certolizumab, etanercept, golimumab, infliximab This medicine may also interact with the following medications:  azathioprine  cyclosporine  interferons  6-mercaptopurine  methotrexate  other medicines that lower your chance of fighting an infection  steroid medicines like prednisone or  cortisone  vaccines This list may not describe all possible interactions. Give your health care provider a list of all the medicines, herbs, non-prescription drugs, or dietary supplements you use. Also tell them if you smoke, drink alcohol, or use illegal drugs. Some items may interact with your medicine. What should I watch for while using this medicine? Your condition will be monitored carefully while you are receiving this medicine. Visit your doctor for regular check ups. Tell your doctor or healthcare professional if your symptoms do not start to get better or if they get worse. Stay away from people who are sick. Call your doctor or health care professional for advice if you get a fever, chills or sore throat, or other symptoms of a cold or flu. Do not treat yourself. In some patients, this medicine may cause a serious brain infection that may cause death. If you have any problems seeing, thinking, speaking, walking, or standing, tell your doctor right away. If you cannot reach your doctor, get urgent medical care. What side effects may I notice from receiving this medicine? Side effects that you should report to your doctor or health care professional as soon as possible:  allergic reactions like skin rash, itching or hives, swelling of the face, lips, or tongue  breathing problems  changes in vision  chest pain  confusion  depressed mood  dizziness  feeling faint; lightheaded; falls  general ill feeling or flu-like symptoms  loss of memory  missed menstrual periods  muscle weakness  problems with balance, talking, or walking  signs and symptoms of liver injury like dark yellow or brown urine; general ill feeling or flu-like symptoms; light-colored   stools; loss of appetite; nausea; right upper belly pain; unusually weak or tired; yellowing of the eyes or skin  suicidal thoughts, mood changes  unusual bruising or bleeding  unusually weak or tired Side effects that  usually do not require medical attention (report to your doctor or health care professional if they continue or are bothersome):  headache  joint pain  muscle cramps  muscle pain  nausea, vomiting  pain, redness, or irritation at site where injected  tiredness This list may not describe all possible side effects. Call your doctor for medical advice about side effects. You may report side effects to FDA at 1-800-FDA-1088. Where should I keep my medicine? This drug is given in a hospital or clinic and will not be stored at home. NOTE: This sheet is a summary. It may not cover all possible information. If you have questions about this medicine, talk to your doctor, pharmacist, or health care provider.  2020 Elsevier/Gold Standard (2019-03-15 13:20:26)  

## 2020-02-25 ENCOUNTER — Ambulatory Visit (HOSPITAL_COMMUNITY)
Admission: RE | Admit: 2020-02-25 | Discharge: 2020-02-25 | Disposition: A | Discharge status: Home / Self Care | Payer: Medicaid Other | Source: Ambulatory Visit | Attending: Internal Medicine | Admitting: Internal Medicine

## 2020-02-25 ENCOUNTER — Other Ambulatory Visit: Payer: Self-pay

## 2020-02-25 DIAGNOSIS — G35 Multiple sclerosis: Secondary | ICD-10-CM | POA: Diagnosis not present

## 2020-02-25 MED ORDER — SODIUM CHLORIDE 0.9 % IV SOLN
300.0000 mg | Freq: Once | INTRAVENOUS | Status: AC
  Administered 2020-02-25: 300 mg via INTRAVENOUS
  Filled 2020-02-25: qty 15

## 2020-02-25 MED ORDER — SODIUM CHLORIDE 0.9 % IV SOLN
INTRAVENOUS | Status: DC | PRN
  Administered 2020-02-25: 250 mL via INTRAVENOUS

## 2020-02-25 NOTE — Progress Notes (Signed)
Patient received IV Tysabri as ordered by Sater, Richard MD. Patient declined the 60 minutes post infusion observation.Tolerated well, vitals stable, discharge instructions given, verbalized understanding. Patient alert, oriented and ambulatory at the time of discharge.  

## 2020-02-25 NOTE — Discharge Instructions (Signed)
Natalizumab injection What is this medicine? NATALIZUMAB (na ta LIZ you mab) is used to treat relapsing multiple sclerosis. This drug is not a cure. It is also used to treat Crohn's disease. This medicine may be used for other purposes; ask your health care provider or pharmacist if you have questions. COMMON BRAND NAME(S): Tysabri What should I tell my health care provider before I take this medicine? They need to know if you have any of these conditions:  immune system problems  progressive multifocal leukoencephalopathy (PML)  an unusual or allergic reaction to natalizumab, other medicines, foods, dyes, or preservatives  pregnant or trying to get pregnant  breast-feeding How should I use this medicine? This medicine is for infusion into a vein. It is given by a health care professional in a hospital or clinic setting. A special MedGuide will be given to you by the pharmacist with each prescription and refill. Be sure to read this information carefully each time. Talk to your pediatrician regarding the use of this medicine in children. This medicine is not approved for use in children. Overdosage: If you think you have taken too much of this medicine contact a poison control center or emergency room at once. NOTE: This medicine is only for you. Do not share this medicine with others. What if I miss a dose? It is important not to miss your dose. Call your doctor or health care professional if you are unable to keep an appointment. What may interact with this medicine? Do not take this medicine with any of the following medications:  biologic medicines such as adalimumab, certolizumab, etanercept, golimumab, infliximab This medicine may also interact with the following medications:  azathioprine  cyclosporine  interferons  6-mercaptopurine  methotrexate  other medicines that lower your chance of fighting an infection  steroid medicines like prednisone or  cortisone  vaccines This list may not describe all possible interactions. Give your health care provider a list of all the medicines, herbs, non-prescription drugs, or dietary supplements you use. Also tell them if you smoke, drink alcohol, or use illegal drugs. Some items may interact with your medicine. What should I watch for while using this medicine? Your condition will be monitored carefully while you are receiving this medicine. Visit your doctor for regular check ups. Tell your doctor or healthcare professional if your symptoms do not start to get better or if they get worse. Stay away from people who are sick. Call your doctor or health care professional for advice if you get a fever, chills or sore throat, or other symptoms of a cold or flu. Do not treat yourself. In some patients, this medicine may cause a serious brain infection that may cause death. If you have any problems seeing, thinking, speaking, walking, or standing, tell your doctor right away. If you cannot reach your doctor, get urgent medical care. What side effects may I notice from receiving this medicine? Side effects that you should report to your doctor or health care professional as soon as possible:  allergic reactions like skin rash, itching or hives, swelling of the face, lips, or tongue  breathing problems  changes in vision  chest pain  confusion  depressed mood  dizziness  feeling faint; lightheaded; falls  general ill feeling or flu-like symptoms  loss of memory  missed menstrual periods  muscle weakness  problems with balance, talking, or walking  signs and symptoms of liver injury like dark yellow or brown urine; general ill feeling or flu-like symptoms; light-colored   stools; loss of appetite; nausea; right upper belly pain; unusually weak or tired; yellowing of the eyes or skin  suicidal thoughts, mood changes  unusual bruising or bleeding  unusually weak or tired Side effects that  usually do not require medical attention (report to your doctor or health care professional if they continue or are bothersome):  headache  joint pain  muscle cramps  muscle pain  nausea, vomiting  pain, redness, or irritation at site where injected  tiredness This list may not describe all possible side effects. Call your doctor for medical advice about side effects. You may report side effects to FDA at 1-800-FDA-1088. Where should I keep my medicine? This drug is given in a hospital or clinic and will not be stored at home. NOTE: This sheet is a summary. It may not cover all possible information. If you have questions about this medicine, talk to your doctor, pharmacist, or health care provider.  2020 Elsevier/Gold Standard (2019-03-15 13:20:26)  

## 2020-03-17 ENCOUNTER — Encounter: Payer: Self-pay | Admitting: Neurology

## 2020-03-17 ENCOUNTER — Other Ambulatory Visit: Payer: Self-pay

## 2020-03-17 ENCOUNTER — Telehealth: Payer: Self-pay | Admitting: *Deleted

## 2020-03-17 ENCOUNTER — Ambulatory Visit: Payer: Medicaid Other | Admitting: Neurology

## 2020-03-17 VITALS — BP 101/59 | HR 81 | Ht 67.0 in | Wt 179.0 lb

## 2020-03-17 DIAGNOSIS — R269 Unspecified abnormalities of gait and mobility: Secondary | ICD-10-CM

## 2020-03-17 DIAGNOSIS — R3915 Urgency of urination: Secondary | ICD-10-CM

## 2020-03-17 DIAGNOSIS — Z5181 Encounter for therapeutic drug level monitoring: Secondary | ICD-10-CM

## 2020-03-17 DIAGNOSIS — G35 Multiple sclerosis: Secondary | ICD-10-CM

## 2020-03-17 DIAGNOSIS — R200 Anesthesia of skin: Secondary | ICD-10-CM | POA: Diagnosis not present

## 2020-03-17 MED ORDER — NORTRIPTYLINE HCL 10 MG PO CAPS: 10.0000 mg | ORAL_CAPSULE | Freq: Two times a day (BID) | ORAL | 11 refills | Status: DC

## 2020-03-17 MED ORDER — ARMODAFINIL 200 MG PO TABS: ORAL_TABLET | ORAL | 5 refills | Status: DC

## 2020-03-17 NOTE — Telephone Encounter (Signed)
Placed JCV lab in [facility] lock box for routine lab pick up. Results pending. 

## 2020-03-17 NOTE — Progress Notes (Signed)
[facility]  PATIENT: Christie RISHEL DOB: [birth year]  REFERRING DOCTOR OR PCP: [facility] SOURCE: Patient, notes from Dr. Malvin Johns.  MRI laboratory reports reviewed.  MRI images personally reviewed. _________________________________   HISTORICAL  CHIEF COMPLAINT:  Chief Complaint  Patient presents with  . Follow-up    MS; patient states she has been doing ok, no symptom changes. Her headaches are starting to come back. She takes the Nortriptyline but has a headache on the L side. She is getting Tysabri infusions and feels she tolerates them well other than being tired after the infusion. She ran out of Armodafinil and never filled it. Pt aware refills on file and she will try to fill it today.    Marland Kitchen Room 13    here alone     HISTORY OF PRESENT ILLNESS:  Christie Oconnor is a 34 y.o. woman with relapsing remitting multiple sclerosis.  Update 03/17/2020: She is on Tysabri and tolerates it well.   She denies any exacerbations or new symptoms.     She is walking without aid but is mildly off balanced.   She needs to hold the bannister with stairs.  The laft hand is slightly weaker than her right and opening jars is more difficult.   However, the arm is better than earlier this year.   She has tingling in her arms, left > right that is annoying but not painful.      She still gets headaches but she has fewer with nortriptyline.   She does not have N/V or photophobia.   She has headache now.     Vit D was low and she takes a weekly high dose supplement.    She has urinary urgency but would like to hold on a treatment at this time.     MS History  At age 25, she had the onset of left chest numbness in October 2020.  As the day went on, she had tingling in the left arm which built up over the day.    In the past she had had similar episodes that were less intense and only lasting for minutes.   When symptoms persisted, she went to [facility] ED.   She  had MRI of the brain showing lesions in the hemispheres.    She was admitted for IV Steroids x 4 or 5 days.   She was referrred to Dr. Malvin Johns who ordered a LP.    CSF showed oligoclonal bands.    She had MRI of the cervical spine showing several lesions in the spinal cord but no enhancement.    Tysabri was discussed with her but she has not started yet.       Imaging: 10/04/19 cervical spine MRI showed a left central focus at C2-C3 ; bilateral foci at C4.  None enhanced  07/17/2019 brain MRI showed multiple T2 hyperintense foci in hemispheres c/w MS.    Labs: Initial JCV Ab was 0.29  REVIEW OF SYSTEMS: Constitutional: No fevers, chills, sweats, or change in appetite Eyes: No visual changes, double vision, eye pain Ear, nose and throat: No hearing loss, ear pain, nasal congestion, sore throat Cardiovascular: No chest pain, palpitations Respiratory: No shortness of breath at rest or with exertion.   No wheezes GastrointestinaI: No nausea, vomiting, diarrhea, abdominal pain, fecal incontinence Genitourinary: No dysuria.  She has urinary urgency and nocturia. Musculoskeletal: No neck pain, back pain Integumentary: No rash, pruritus, skin lesions Neurological: as above Psychiatric: No depression at this  time.  No anxiety Endocrine: No palpitations, diaphoresis, change in appetite, change in weigh or increased thirst Hematologic/Lymphatic: No anemia, purpura, petechiae. Allergic/Immunologic: No itchy/runny eyes, nasal congestion, recent allergic reactions, rashes  ALLERGIES: No Known Allergies  HOME MEDICATIONS:  Current Outpatient Medications:  .  aspirin 81 MG EC tablet, Take 81 mg by mouth daily., Disp: , Rfl:  .  gabapentin (NEURONTIN) 300 MG capsule, One po tid, Disp: 90 capsule, Rfl: 11 .  ibuprofen (ADVIL) 600 MG tablet, Take 1 tablet by mouth as needed., Disp: , Rfl:  .  imipramine (TOFRANIL) 25 MG tablet, Take 1 tablet (25 mg total) by mouth at bedtime., Disp: 30 tablet,  Rfl: 5 .  natalizumab (TYSABRI) 300 MG/15ML injection, Inject into the vein., Disp: , Rfl:  .  nortriptyline (PAMELOR) 10 MG capsule, Take 10 mg by mouth at bedtime., Disp: , Rfl:  .  Vitamin D, Ergocalciferol, (DRISDOL) 1.25 MG (50000 UNIT) CAPS capsule, Take 1 capsule (50,000 Units total) by mouth every 7 (seven) days., Disp: 13 capsule, Rfl: 4 .  Armodafinil 200 MG TABS, One po qAM (Patient not taking: Reported on 03/17/2020), Disp: 30 tablet, Rfl: 5  PAST MEDICAL HISTORY: History reviewed. No pertinent past medical history.  PAST SURGICAL HISTORY: Past Surgical History:  Procedure Laterality Date  . ANKLE SURGERY Left   . CESAREAN SECTION     x 2  . TUBAL LIGATION      FAMILY HISTORY: Family History  Problem Relation Age of Onset  . Multiple sclerosis Neg Hx     SOCIAL HISTORY:  Social History   Socioeconomic History  . Marital status: Single    Spouse name: Not on file  . Number of children: 2  . Years of education: Not on file  . Highest education level: Not on file  Occupational History  . Occupation: Unemployed  Tobacco Use  . Smoking status: Current Every Day Smoker  . Smokeless tobacco: Never Used  . Tobacco comment: 6 cigarettes per day  Vaping Use  . Vaping Use: Never used  Substance and Sexual Activity  . Alcohol use: Not Currently    Comment: none in past 2 months (updated 03/15/20)  . Drug use: Not Currently  . Sexual activity: Not on file  Other Topics Concern  . Not on file  Social History Narrative   Lives with family   Caffeine use:  1/2 gallon tea per day, some soda (usually only drinks when she has headache)   Right handed    Social Determinants of Health   Financial Resource Strain:   . Difficulty of Paying Living Expenses:   Food Insecurity:   . Worried About Programme researcher, broadcasting/film/video in the Last Year:   . Barista in the Last Year:   Transportation Needs:   . Freight forwarder (Medical):   Marland Kitchen Lack of Transportation  (Non-Medical):   Physical Activity:   . Days of Exercise per Week:   . Minutes of Exercise per Session:   Stress:   . Feeling of Stress :   Social Connections:   . Frequency of Communication with Friends and Family:   . Frequency of Social Gatherings with Friends and Family:   . Attends Religious Services:   . Active Member of Clubs or Organizations:   . Attends Banker Meetings:   Marland Kitchen Marital Status:   Intimate Partner Violence:   . Fear of Current or Ex-Partner:   . Emotionally Abused:   Marland Kitchen Physically  Abused:   . Sexually Abused:      PHYSICAL EXAM  Vitals:   03/17/20 1037  BP: (!) 101/59  Pulse: 81  Weight: 179 lb (81.2 kg)  Height: 5\' 7"  (1.702 m)    Body mass index is 28.04 kg/m.    General: The patient is well-developed and well-nourished and in no acute distress  HEENT:  Head is [place]/AT.  Sclera are anicteric.    Neck: The neck is nontender.  Good ROM   Skin: Extremities are without rash or  edema.  Neurologic Exam  Mental status: The patient is alert and oriented x 3 at the time of the examination. The patient has apparent normal recent and remote memory, with an apparently normal attention span and concentration ability.   Speech is normal.  Cranial nerves: Extraocular movements are full.  . There is good facial sensation to soft touch bilaterally.Facial strength is normal.  Trapezius and sternocleidomastoid strength is normal. No dysarthria is noted.    No obvious hearing deficits are noted.  Motor:  Muscle bulk is normal.   Tone is normal. Strength is  5 / 5 in arms and right leg and 4+/5 in proximal left leg.  Rapid alternating movements are reduced in the left hand and foot.   Sensory: She has intact sensation to touch and vibration on the right.  She has reduced sensation to  vibration in the left leg and reduced sensation to temperature and vibratoinin the left arm    Coordination: Cerebellar testing reveals good finger-nose-finger and  heel-to-shin bilaterally.  Gait and station: Station is normal.  Gait is slightly wide.  Tandem gait is wide.. Romberg is negative.   Reflexes: Deep tendon reflexes are symmetric in the arms but increased in the left leg relative to the right leg with spread at the knees.  There is no ankle clonus.     DIAGNOSTIC DATA (LABS, IMAGING, TESTING) - I reviewed patient records, labs, notes, testing and imaging myself where available.  Lab Results  Component Value Date   WBC 10.7 (H) 09/10/2019   HGB 12.9 09/10/2019   HCT 38.0 09/10/2019   MCV 96.7 09/10/2019   PLT 253 09/10/2019      Component Value Date/Time   NA 137 07/18/2019 0551   NA 137 06/23/2014 1838   K 3.8 07/18/2019 0551   K 3.7 06/23/2014 1838   CL 102 07/18/2019 0551   CL 103 06/23/2014 1838   CO2 23 07/18/2019 0551   CO2 28 06/23/2014 1838   GLUCOSE 139 (H) 07/18/2019 0551   GLUCOSE 59 (L) 06/23/2014 1838   BUN 18 07/18/2019 0551   BUN 12 06/23/2014 1838   CREATININE 0.78 07/18/2019 0551   CREATININE 0.74 06/23/2014 1838   CALCIUM 9.7 07/18/2019 0551   CALCIUM 8.5 06/23/2014 1838   PROT 7.1 07/15/2019 2127   PROT 7.0 07/22/2013 1107   ALBUMIN 3.8 07/15/2019 2127   ALBUMIN 3.5 07/22/2013 1107   AST 21 07/15/2019 2127   AST 21 07/22/2013 1107   ALT 15 07/15/2019 2127   ALT 17 07/22/2013 1107   ALKPHOS 38 07/15/2019 2127   ALKPHOS 51 07/22/2013 1107   BILITOT 0.7 07/15/2019 2127   BILITOT 0.5 07/22/2013 1107   GFRNONAA >60 07/18/2019 0551   GFRNONAA >60 06/23/2014 1838   GFRNONAA >60 07/22/2013 1107   GFRAA >60 07/18/2019 0551   GFRAA >60 06/23/2014 1838   GFRAA >60 07/22/2013 1107   Lab Results  Component Value Date   CHOL  168 07/16/2019   HDL 63 07/16/2019   LDLCALC 95 07/16/2019   TRIG 50 07/16/2019   CHOLHDL 2.7 07/16/2019   Lab Results  Component Value Date   HGBA1C 4.8 07/16/2019      ASSESSMENT AND PLAN    Multiple sclerosis (HCC) - Plan: Stratify JCV Antibody Test ([facility]), CBC  with Differential/Platelet  Encounter for medication monitoring - Plan: Stratify JCV Antibody Test ([facility]), CBC with Differential/Platelet  Gait disturbance  Numbness  Urinary urgency  1.   Continue Tysabri.  We will check JCV antibody and CBC today..  Around the time of the next visit, we will need to check an MRI of the brain and cervical spine. 2.   Continue vitamin D supplementation. 3.   Continue nortriptyline but increase to 20 mg nightly for frequent migraine headaches. 4.   Armodafinil for sleepiness and fatigue.   5.   Return in 6 months or sooner for new or worsening neurologic symptoms    Sharmarke Cicio A. Epimenio Foot, MD, [facility] 03/17/2020, 11:07 AM Certified in Neurology, Clinical Neurophysiology, Sleep Medicine and Neuroimaging  [facility] [address] [place], [place] [address] [phone]

## 2020-03-18 LAB — CBC WITH DIFFERENTIAL/PLATELET
Basophils Absolute: 0.1 10*3/uL (ref 0.0–0.2)
Basos: 0 %
EOS (ABSOLUTE): 0.1 10*3/uL (ref 0.0–0.4)
Eos: 1 %
Hematocrit: 39 % (ref 34.0–46.6)
Hemoglobin: 13.1 g/dL (ref 11.1–15.9)
Immature Grans (Abs): 0 10*3/uL (ref 0.0–0.1)
Immature Granulocytes: 0 %
Lymphocytes Absolute: 2.2 10*3/uL (ref 0.7–3.1)
Lymphs: 16 %
MCH: 32.2 pg (ref 26.6–33.0)
MCHC: 33.6 g/dL (ref 31.5–35.7)
MCV: 96 fL (ref 79–97)
Monocytes Absolute: 0.6 10*3/uL (ref 0.1–0.9)
Monocytes: 4 %
Neutrophils Absolute: 10.6 10*3/uL — ABNORMAL HIGH (ref 1.4–7.0)
Neutrophils: 79 %
Platelets: 248 10*3/uL (ref 150–450)
RBC: 4.07 x10E6/uL (ref 3.77–5.28)
RDW: 13.6 % (ref 11.7–15.4)
WBC: 13.6 10*3/uL — ABNORMAL HIGH (ref 3.4–10.8)

## 2020-03-23 ENCOUNTER — Ambulatory Visit: Payer: Medicaid Other | Admitting: Neurology

## 2020-03-28 NOTE — Telephone Encounter (Signed)
JCV ab collected on 03/17/20 0.20 indeterminate. Inhibition assay: negative.

## 2020-04-07 ENCOUNTER — Ambulatory Visit (HOSPITAL_COMMUNITY)
Admission: RE | Admit: 2020-04-07 | Discharge: 2020-04-07 | Disposition: A | Discharge status: Home / Self Care | Payer: Medicaid Other | Source: Ambulatory Visit | Attending: Internal Medicine | Admitting: Internal Medicine

## 2020-04-07 ENCOUNTER — Other Ambulatory Visit: Payer: Self-pay

## 2020-04-07 DIAGNOSIS — G35 Multiple sclerosis: Secondary | ICD-10-CM | POA: Insufficient documentation

## 2020-04-07 MED ORDER — SODIUM CHLORIDE 0.9 % IV SOLN
INTRAVENOUS | Status: DC | PRN
  Administered 2020-04-07: 250 mL via INTRAVENOUS

## 2020-04-07 MED ORDER — SODIUM CHLORIDE 0.9 % IV SOLN
300.0000 mg | INTRAVENOUS | Status: DC
  Administered 2020-04-07: 300 mg via INTRAVENOUS
  Filled 2020-04-07: qty 15

## 2020-04-07 NOTE — Discharge Instructions (Signed)
Natalizumab injection What is this medicine? NATALIZUMAB (na ta LIZ you mab) is used to treat relapsing multiple sclerosis. This drug is not a cure. It is also used to treat Crohn's disease. This medicine may be used for other purposes; ask your health care provider or pharmacist if you have questions. COMMON BRAND NAME(S): Tysabri What should I tell my health care provider before I take this medicine? They need to know if you have any of these conditions:  immune system problems  progressive multifocal leukoencephalopathy (PML)  an unusual or allergic reaction to natalizumab, other medicines, foods, dyes, or preservatives  pregnant or trying to get pregnant  breast-feeding How should I use this medicine? This medicine is for infusion into a vein. It is given by a health care professional in a hospital or clinic setting. A special MedGuide will be given to you by the pharmacist with each prescription and refill. Be sure to read this information carefully each time. Talk to your pediatrician regarding the use of this medicine in children. This medicine is not approved for use in children. Overdosage: If you think you have taken too much of this medicine contact a poison control center or emergency room at once. NOTE: This medicine is only for you. Do not share this medicine with others. What if I miss a dose? It is important not to miss your dose. Call your doctor or health care professional if you are unable to keep an appointment. What may interact with this medicine? Do not take this medicine with any of the following medications:  biologic medicines such as adalimumab, certolizumab, etanercept, golimumab, infliximab This medicine may also interact with the following medications:  azathioprine  cyclosporine  interferons  6-mercaptopurine  methotrexate  other medicines that lower your chance of fighting an infection  steroid medicines like prednisone or  cortisone  vaccines This list may not describe all possible interactions. Give your health care provider a list of all the medicines, herbs, non-prescription drugs, or dietary supplements you use. Also tell them if you smoke, drink alcohol, or use illegal drugs. Some items may interact with your medicine. What should I watch for while using this medicine? Your condition will be monitored carefully while you are receiving this medicine. Visit your doctor for regular check ups. Tell your doctor or healthcare professional if your symptoms do not start to get better or if they get worse. Stay away from people who are sick. Call your doctor or health care professional for advice if you get a fever, chills or sore throat, or other symptoms of a cold or flu. Do not treat yourself. In some patients, this medicine may cause a serious brain infection that may cause death. If you have any problems seeing, thinking, speaking, walking, or standing, tell your doctor right away. If you cannot reach your doctor, get urgent medical care. What side effects may I notice from receiving this medicine? Side effects that you should report to your doctor or health care professional as soon as possible:  allergic reactions like skin rash, itching or hives, swelling of the face, lips, or tongue  breathing problems  changes in vision  chest pain  confusion  depressed mood  dizziness  feeling faint; lightheaded; falls  general ill feeling or flu-like symptoms  loss of memory  missed menstrual periods  muscle weakness  problems with balance, talking, or walking  signs and symptoms of liver injury like dark yellow or brown urine; general ill feeling or flu-like symptoms; light-colored   stools; loss of appetite; nausea; right upper belly pain; unusually weak or tired; yellowing of the eyes or skin  suicidal thoughts, mood changes  unusual bruising or bleeding  unusually weak or tired Side effects that  usually do not require medical attention (report to your doctor or health care professional if they continue or are bothersome):  headache  joint pain  muscle cramps  muscle pain  nausea, vomiting  pain, redness, or irritation at site where injected  tiredness This list may not describe all possible side effects. Call your doctor for medical advice about side effects. You may report side effects to FDA at 1-800-FDA-1088. Where should I keep my medicine? This drug is given in a hospital or clinic and will not be stored at home. NOTE: This sheet is a summary. It may not cover all possible information. If you have questions about this medicine, talk to your doctor, pharmacist, or health care provider.  2020 Elsevier/Gold Standard (2019-03-15 13:20:26)  

## 2020-04-07 NOTE — Progress Notes (Signed)
Patient received IV Tysabri as ordered by Despina Arias MD. Patient declined the 60 minutes post infusion observation.Tolerated well, vitals stable, discharge instructions given, verbalized understanding. Patient alert, oriented and ambulatory at the time of discharge.

## 2020-05-15 ENCOUNTER — Ambulatory Visit (HOSPITAL_COMMUNITY)
Admission: RE | Admit: 2020-05-15 | Discharge: 2020-05-15 | Disposition: A | Discharge status: Home / Self Care | Payer: Medicaid Other | Source: Ambulatory Visit | Attending: Internal Medicine | Admitting: Internal Medicine

## 2020-05-15 ENCOUNTER — Other Ambulatory Visit: Payer: Self-pay

## 2020-05-15 DIAGNOSIS — G35 Multiple sclerosis: Secondary | ICD-10-CM | POA: Diagnosis not present

## 2020-05-15 MED ORDER — SODIUM CHLORIDE 0.9 % IV SOLN
300.0000 mg | INTRAVENOUS | Status: DC
  Administered 2020-05-15: 300 mg via INTRAVENOUS
  Filled 2020-05-15: qty 15

## 2020-05-15 MED ORDER — SODIUM CHLORIDE 0.9 % IV SOLN
INTRAVENOUS | Status: DC | PRN
  Administered 2020-05-15: 250 mL via INTRAVENOUS

## 2020-05-15 NOTE — Discharge Instructions (Signed)
Natalizumab injection What is this medicine? NATALIZUMAB (na ta LIZ you mab) is used to treat relapsing multiple sclerosis. This drug is not a cure. It is also used to treat Crohn's disease. This medicine may be used for other purposes; ask your health care provider or pharmacist if you have questions. COMMON BRAND NAME(S): Tysabri What should I tell my health care provider before I take this medicine? They need to know if you have any of these conditions:  immune system problems  progressive multifocal leukoencephalopathy (PML)  an unusual or allergic reaction to natalizumab, other medicines, foods, dyes, or preservatives  pregnant or trying to get pregnant  breast-feeding How should I use this medicine? This medicine is for infusion into a vein. It is given by a health care professional in a hospital or clinic setting. A special MedGuide will be given to you by the pharmacist with each prescription and refill. Be sure to read this information carefully each time. Talk to your pediatrician regarding the use of this medicine in children. This medicine is not approved for use in children. Overdosage: If you think you have taken too much of this medicine contact a poison control center or emergency room at once. NOTE: This medicine is only for you. Do not share this medicine with others. What if I miss a dose? It is important not to miss your dose. Call your doctor or health care professional if you are unable to keep an appointment. What may interact with this medicine? Do not take this medicine with any of the following medications:  biologic medicines such as adalimumab, certolizumab, etanercept, golimumab, infliximab This medicine may also interact with the following medications:  azathioprine  cyclosporine  interferons  6-mercaptopurine  methotrexate  other medicines that lower your chance of fighting an infection  steroid medicines like prednisone or  cortisone  vaccines This list may not describe all possible interactions. Give your health care provider a list of all the medicines, herbs, non-prescription drugs, or dietary supplements you use. Also tell them if you smoke, drink alcohol, or use illegal drugs. Some items may interact with your medicine. What should I watch for while using this medicine? Your condition will be monitored carefully while you are receiving this medicine. Visit your doctor for regular check ups. Tell your doctor or healthcare professional if your symptoms do not start to get better or if they get worse. Stay away from people who are sick. Call your doctor or health care professional for advice if you get a fever, chills or sore throat, or other symptoms of a cold or flu. Do not treat yourself. In some patients, this medicine may cause a serious brain infection that may cause death. If you have any problems seeing, thinking, speaking, walking, or standing, tell your doctor right away. If you cannot reach your doctor, get urgent medical care. What side effects may I notice from receiving this medicine? Side effects that you should report to your doctor or health care professional as soon as possible:  allergic reactions like skin rash, itching or hives, swelling of the face, lips, or tongue  breathing problems  changes in vision  chest pain  confusion  depressed mood  dizziness  feeling faint; lightheaded; falls  general ill feeling or flu-like symptoms  loss of memory  missed menstrual periods  muscle weakness  problems with balance, talking, or walking  signs and symptoms of liver injury like dark yellow or brown urine; general ill feeling or flu-like symptoms; light-colored   stools; loss of appetite; nausea; right upper belly pain; unusually weak or tired; yellowing of the eyes or skin  suicidal thoughts, mood changes  unusual bruising or bleeding  unusually weak or tired Side effects that  usually do not require medical attention (report to your doctor or health care professional if they continue or are bothersome):  headache  joint pain  muscle cramps  muscle pain  nausea, vomiting  pain, redness, or irritation at site where injected  tiredness This list may not describe all possible side effects. Call your doctor for medical advice about side effects. You may report side effects to FDA at 1-800-FDA-1088. Where should I keep my medicine? This drug is given in a hospital or clinic and will not be stored at home. NOTE: This sheet is a summary. It may not cover all possible information. If you have questions about this medicine, talk to your doctor, pharmacist, or health care provider.  2020 Elsevier/Gold Standard (2019-03-15 13:20:26)  

## 2020-05-15 NOTE — Progress Notes (Signed)
Patient received IV Tysabri as ordered by Sater, Richard MD. Patient declined the 60 minutes post infusion observation.Tolerated well, vitals stable, discharge instructions given, verbalized understanding. Patient alert, oriented and ambulatory at the time of discharge.  

## 2020-05-31 ENCOUNTER — Emergency Department (HOSPITAL_COMMUNITY): Payer: Medicaid Other

## 2020-05-31 ENCOUNTER — Emergency Department (HOSPITAL_COMMUNITY)
Admission: EM | Admit: 2020-05-31 | Discharge: 2020-05-31 | Disposition: A | Discharge status: Home / Self Care | Payer: Medicaid Other | Attending: Emergency Medicine | Admitting: Emergency Medicine

## 2020-05-31 ENCOUNTER — Encounter (HOSPITAL_COMMUNITY): Payer: Self-pay

## 2020-05-31 ENCOUNTER — Other Ambulatory Visit: Payer: Self-pay

## 2020-05-31 DIAGNOSIS — Z7982 Long term (current) use of aspirin: Secondary | ICD-10-CM | POA: Insufficient documentation

## 2020-05-31 DIAGNOSIS — F17200 Nicotine dependence, unspecified, uncomplicated: Secondary | ICD-10-CM | POA: Diagnosis not present

## 2020-05-31 DIAGNOSIS — G35 Multiple sclerosis: Secondary | ICD-10-CM | POA: Diagnosis not present

## 2020-05-31 DIAGNOSIS — R519 Headache, unspecified: Secondary | ICD-10-CM

## 2020-05-31 DIAGNOSIS — Z79899 Other long term (current) drug therapy: Secondary | ICD-10-CM | POA: Diagnosis not present

## 2020-05-31 HISTORY — DX: Multiple sclerosis: G35

## 2020-05-31 LAB — BASIC METABOLIC PANEL
Anion gap: 9 (ref 5–15)
BUN: 15 mg/dL (ref 6–20)
CO2: 25 mmol/L (ref 22–32)
Calcium: 9 mg/dL (ref 8.9–10.3)
Chloride: 104 mmol/L (ref 98–111)
Creatinine, Ser: 0.88 mg/dL (ref 0.44–1.00)
GFR calc Af Amer: >60 mL/min (ref >60)
GFR calc non Af Amer: >60 mL/min (ref >60)
Glucose, Bld: 96 mg/dL (ref 70–99)
Potassium: 3.6 mmol/L (ref 3.5–5.1)
Sodium: 138 mmol/L (ref 135–145)

## 2020-05-31 LAB — CBC WITH DIFFERENTIAL/PLATELET
Abs Immature Granulocytes: 0.02 10*3/uL (ref 0.00–0.07)
Basophils Absolute: 0 10*3/uL (ref 0.0–0.1)
Basophils Relative: 0 %
Eosinophils Absolute: 0.2 10*3/uL (ref 0.0–0.5)
Eosinophils Relative: 3 %
HCT: 36.5 % (ref 36.0–46.0)
Hemoglobin: 12.3 g/dL (ref 12.0–15.0)
Immature Granulocytes: 0 %
Lymphocytes Relative: 22 %
Lymphs Abs: 1.3 10*3/uL (ref 0.7–4.0)
MCH: 32.2 pg (ref 26.0–34.0)
MCHC: 33.7 g/dL (ref 30.0–36.0)
MCV: 95.5 fL (ref 80.0–100.0)
Monocytes Absolute: 0.9 10*3/uL (ref 0.1–1.0)
Monocytes Relative: 16 %
Neutro Abs: 3.3 10*3/uL (ref 1.7–7.7)
Neutrophils Relative %: 59 %
Platelets: 209 10*3/uL (ref 150–400)
RBC: 3.82 MIL/uL — ABNORMAL LOW (ref 3.87–5.11)
RDW: 14.5 % (ref 11.5–15.5)
WBC: 5.7 10*3/uL (ref 4.0–10.5)
nRBC: 0.7 % — ABNORMAL HIGH (ref 0.0–0.2)

## 2020-05-31 LAB — URINALYSIS, ROUTINE W REFLEX MICROSCOPIC
Bilirubin Urine: NEGATIVE
Glucose, UA: NEGATIVE mg/dL
Hgb urine dipstick: NEGATIVE
Ketones, ur: 5 mg/dL — AB
Leukocytes,Ua: NEGATIVE
Nitrite: POSITIVE — AB
Protein, ur: NEGATIVE mg/dL
Specific Gravity, Urine: 1.023 (ref 1.005–1.030)
pH: 6 (ref 5.0–8.0)

## 2020-05-31 LAB — PREGNANCY, URINE: Preg Test, Ur: NEGATIVE

## 2020-05-31 MED ORDER — DEXAMETHASONE SODIUM PHOSPHATE 10 MG/ML IJ SOLN
10.0000 mg | Freq: Once | INTRAMUSCULAR | Status: AC
  Administered 2020-05-31: 10 mg via INTRAVENOUS
  Filled 2020-05-31: qty 1

## 2020-05-31 NOTE — ED Triage Notes (Signed)
Pt reports has MS and c/o head pain, pain in both shoulders, neck, and leg weakness x 3 days.  Pt tearful.

## 2020-05-31 NOTE — ED Provider Notes (Signed)
[facility] EMERGENCY DEPARTMENT Provider Note   CSN: [id number] Arrival date & time: 05/31/20  1013     History Chief Complaint  Patient presents with  . Headache    Christie Oconnor is a 34 y.o. female.  HPI      Christie Oconnor is a 34 y.o. female with past medical history of MS, diagnosed in October 2020.  she presents to the Emergency Department complaining of gradually worsening headache, bilateral shoulder pain, neck pain and extremity weakness.  Symptoms have been present for 2 to 3 days.  States her current symptoms feel similar to when she was diagnosed with MS.  Missed several doses of her gabapentin recently.  She describes "knots" of her scalp that are tender to palpation.  She states that when these areas of her scalp becomes severely tender triggers headaches.  She also endorses having a sharp pain behind her left ear that radiates down her spine to the level of her mid back.  She notes having left-sided weakness and difficulty with gait since her diagnosis.  She denies fever, chills, visual changes, neck stiffness, abdominal pain, nausea or vomiting.  PCP  [facility] Neurologist: Dr. Epimenio Foot  Past Medical History:  Diagnosis Date  . MS (multiple sclerosis) [facility])     Patient Active Problem List   Diagnosis Date Noted  . Multiple sclerosis (HCC) 12/22/2019  . Gait disturbance 12/22/2019  . Numbness 12/22/2019  . Frequent headaches 12/22/2019  . Urinary urgency 12/22/2019  . CVA (cerebral vascular accident) (HCC) 07/15/2019    Past Surgical History:  Procedure Laterality Date  . ANKLE SURGERY Left   . CESAREAN SECTION     x 2  . TUBAL LIGATION       OB History   No obstetric history on file.     Family History  Problem Relation Age of Onset  . Multiple sclerosis Neg Hx     Social History   Tobacco Use  . Smoking status: Current Every Day Smoker  . Smokeless tobacco: Never Used  . Tobacco comment: 6 cigarettes per day  Vaping Use   . Vaping Use: Never used  Substance Use Topics  . Alcohol use: Yes    Comment: occ  . Drug use: Not Currently    Home Medications Prior to Admission medications   Medication Sig Start Date End Date Taking? Authorizing Provider  Armodafinil 200 MG TABS One po qAM 03/17/20   Sater, Pearletha Furl, MD  aspirin 81 MG EC tablet Take 81 mg by mouth daily.    [provider]  gabapentin (NEURONTIN) 300 MG capsule One po tid 12/22/19   Sater, Pearletha Furl, MD  ibuprofen (ADVIL) 600 MG tablet Take 1 tablet by mouth as needed. 10/28/15   [provider]  natalizumab (TYSABRI) 300 MG/15ML injection Inject into the vein.    [provider]  nortriptyline (PAMELOR) 10 MG capsule Take 1 capsule (10 mg total) by mouth 2 (two) times daily. 03/17/20   Sater, Pearletha Furl, MD  Vitamin D, Ergocalciferol, (DRISDOL) 1.25 MG (50000 UNIT) CAPS capsule Take 1 capsule (50,000 Units total) by mouth every 7 (seven) days. 12/22/19   Sater, Pearletha Furl, MD    Allergies    Patient has no known allergies.  Review of Systems   Review of Systems  Constitutional: Negative for chills, fatigue and fever.  HENT: Negative for sore throat.   Respiratory: Negative for cough and shortness of breath.   Cardiovascular: Negative for chest pain  and palpitations.  Gastrointestinal: Negative for abdominal pain, diarrhea, nausea and vomiting.  Genitourinary: Negative for difficulty urinating, dysuria and hematuria.  Musculoskeletal: Negative for arthralgias, back pain, myalgias, neck pain and neck stiffness.  Skin: Negative for rash.  Neurological: Positive for headaches. Negative for dizziness, seizures, syncope, speech difficulty, weakness and numbness.  Hematological: Does not bruise/bleed easily.       Physical Exam Updated Vital Signs BP 110/77 (BP Location: Right Arm)   Pulse 86   Temp 98 F (36.7 C) (Oral)   Resp 18   Ht 5\' 7"  (1.702 m)   Wt 79.4 kg   SpO2 99%   BMI 27.41 kg/m   Physical  Exam Vitals and nursing note reviewed.  Constitutional:      General: She is not in acute distress.    Appearance: Normal appearance. She is well-developed. She is not ill-appearing.  HENT:     Head: Normocephalic.     Mouth/Throat:     Mouth: Mucous membranes are moist.  Eyes:     Extraocular Movements: Extraocular movements intact.     Conjunctiva/sclera: Conjunctivae normal.     Pupils: Pupils are equal, round, and reactive to light.  Neck:     Thyroid: No thyromegaly.     Meningeal: Kernig's sign absent.  Cardiovascular:     Rate and Rhythm: Normal rate and regular rhythm.     Pulses: Normal pulses.  Pulmonary:     Effort: Pulmonary effort is normal.     Breath sounds: Normal breath sounds. No wheezing.  Abdominal:     Palpations: Abdomen is soft.     Tenderness: There is no abdominal tenderness. There is no guarding or rebound.  Musculoskeletal:        General: Normal range of motion.     Cervical back: Full passive range of motion without pain, normal range of motion and neck supple.  Skin:    General: Skin is warm.     Capillary Refill: Capillary refill takes less than 2 seconds.     Findings: No rash.  Neurological:     General: No focal deficit present.     Mental Status: She is alert.     Sensory: Sensation is intact. No sensory deficit.     Motor: Weakness present.     Comments: CN II through XII intact.  Speech clear.  No pronator drift. 4/5 muscle weakness of the left upper and lower extremities.  Grip strength slightly weaker on the left     ED Results / Procedures / Treatments   Labs (all labs ordered are listed, but only abnormal results are displayed) Labs Reviewed  CBC WITH DIFFERENTIAL/PLATELET - Abnormal; Notable for the following components:      Result Value   RBC 3.82 (*)    nRBC 0.7 (*)    All other components within normal limits  URINALYSIS, ROUTINE W REFLEX MICROSCOPIC - Abnormal; Notable for the following components:   APPearance HAZY (*)     Ketones, ur 5 (*)    Nitrite POSITIVE (*)    Bacteria, UA RARE (*)    All other components within normal limits  URINE CULTURE  BASIC METABOLIC PANEL  PREGNANCY, URINE    EKG EKG Interpretation  Date/Time:  Wednesday May 31 2020 10:45:51 EDT Ventricular Rate:  70 PR Interval:    QRS Duration: 84 QT Interval:  406 QTC Calculation: 439 R Axis:   66 Text Interpretation: Sinus rhythm Baseline wander Confirmed by 03-08-2004 (Cathren Laine) on 05/31/2020  10:50:15 AM   Radiology No results found.  Procedures Procedures (including critical care time)  Medications Ordered in ED Medications - No data to display  ED Course  I have reviewed the triage vital signs and the nursing notes.  Pertinent labs & imaging results that were available during my care of the patient were reviewed by me and considered in my medical decision making (see chart for details).    MDM Rules/Calculators/A&P                          Pt with hx of MS, here with headache of gradual onset several days ago, no vomiting, meningeal signs, or fever.  Has mild left sided weakness at baseline. Missed her gabapentin dose for several days, pt unable to quantify exact missed doses.    Doubt infectious process, no clinical findings to suggest intracranial bleed. Likely MS flare.  Will obtain labs and CT head. EKG unremarkable.    On recheck, pt has received IV steroid, reports feeling better and headache has improved from 10 to 5.  States she is ready for d/c home.  Urine shows nitrite positive w/o leukocytes, WBC's and rare bacteria. CT head w/o acute findings. Pt denies dysuria sx's.  Will culture her urine, will hold abx at this time.  work up otherwise reassuring.  Pt agrees to close f/u with her neurologist.  Return precautions given    Final Clinical Impression(s) / ED Diagnoses Final diagnoses:  Headache disorder  Multiple sclerosis [facility])    Rx / DC Orders ED Discharge Orders    None        Pauline Aus, PA-C 05/31/20 1546    Cathren Laine, MD 06/03/20 [phone]

## 2020-05-31 NOTE — ED Notes (Signed)
Pt in bed in hallway, pt unable to sign, W.O.W. unavailable at this time, pt verbalized understanding d/c instructions and follow up, pt to car with family via wc.

## 2020-05-31 NOTE — Discharge Instructions (Addendum)
Call Dr. Bonnita Hollow office to arrange a follow-up appt.    Return to emergency department for any worsening symptoms.

## 2020-05-31 NOTE — ED Notes (Signed)
Pt gone to CT 

## 2020-05-31 NOTE — ED Notes (Signed)
Pt up to the bathroom to give urine sample

## 2020-05-31 NOTE — ED Notes (Signed)
Please asleep. Equal rise and fall of the chest.

## 2020-05-31 NOTE — ED Notes (Signed)
Patient transported to CT 

## 2020-06-03 LAB — URINE CULTURE: Culture: >=100000 — AB

## 2020-06-26 ENCOUNTER — Ambulatory Visit (HOSPITAL_COMMUNITY)
Admission: RE | Admit: 2020-06-26 | Discharge: 2020-06-26 | Disposition: A | Discharge status: Home / Self Care | Payer: Medicaid Other | Source: Ambulatory Visit | Attending: Internal Medicine | Admitting: Internal Medicine

## 2020-06-26 DIAGNOSIS — G35 Multiple sclerosis: Secondary | ICD-10-CM | POA: Insufficient documentation

## 2020-07-17 ENCOUNTER — Ambulatory Visit (HOSPITAL_COMMUNITY)
Admission: RE | Admit: 2020-07-17 | Discharge: 2020-07-17 | Disposition: A | Discharge status: Home / Self Care | Payer: Medicaid Other | Source: Ambulatory Visit | Attending: Internal Medicine | Admitting: Internal Medicine

## 2020-07-17 ENCOUNTER — Other Ambulatory Visit: Payer: Self-pay

## 2020-07-17 DIAGNOSIS — G35 Multiple sclerosis: Secondary | ICD-10-CM | POA: Diagnosis not present

## 2020-07-17 MED ORDER — SODIUM CHLORIDE 0.9 % IV SOLN
300.0000 mg | INTRAVENOUS | Status: DC
  Administered 2020-07-17: 300 mg via INTRAVENOUS
  Filled 2020-07-17: qty 15

## 2020-07-17 MED ORDER — SODIUM CHLORIDE 0.9 % IV SOLN
INTRAVENOUS | Status: DC | PRN
  Administered 2020-07-17: 250 mL via INTRAVENOUS

## 2020-07-17 NOTE — Discharge Instructions (Signed)
Natalizumab injection What is this medicine? NATALIZUMAB (na ta LIZ you mab) is used to treat relapsing multiple sclerosis. This drug is not a cure. It is also used to treat Crohn's disease. This medicine may be used for other purposes; ask your health care provider or pharmacist if you have questions. COMMON BRAND NAME(S): Tysabri What should I tell my health care provider before I take this medicine? They need to know if you have any of these conditions:  immune system problems  progressive multifocal leukoencephalopathy (PML)  an unusual or allergic reaction to natalizumab, other medicines, foods, dyes, or preservatives  pregnant or trying to get pregnant  breast-feeding How should I use this medicine? This medicine is for infusion into a vein. It is given by a health care professional in a hospital or clinic setting. A special MedGuide will be given to you by the pharmacist with each prescription and refill. Be sure to read this information carefully each time. Talk to your pediatrician regarding the use of this medicine in children. This medicine is not approved for use in children. Overdosage: If you think you have taken too much of this medicine contact a poison control center or emergency room at once. NOTE: This medicine is only for you. Do not share this medicine with others. What if I miss a dose? It is important not to miss your dose. Call your doctor or health care professional if you are unable to keep an appointment. What may interact with this medicine? Do not take this medicine with any of the following medications:  biologic medicines such as adalimumab, certolizumab, etanercept, golimumab, infliximab This medicine may also interact with the following medications:  azathioprine  cyclosporine  interferons  6-mercaptopurine  methotrexate  other medicines that lower your chance of fighting an infection  steroid medicines like prednisone or  cortisone  vaccines This list may not describe all possible interactions. Give your health care provider a list of all the medicines, herbs, non-prescription drugs, or dietary supplements you use. Also tell them if you smoke, drink alcohol, or use illegal drugs. Some items may interact with your medicine. What should I watch for while using this medicine? Your condition will be monitored carefully while you are receiving this medicine. Visit your doctor for regular check ups. Tell your doctor or healthcare professional if your symptoms do not start to get better or if they get worse. Stay away from people who are sick. Call your doctor or health care professional for advice if you get a fever, chills or sore throat, or other symptoms of a cold or flu. Do not treat yourself. In some patients, this medicine may cause a serious brain infection that may cause death. If you have any problems seeing, thinking, speaking, walking, or standing, tell your doctor right away. If you cannot reach your doctor, get urgent medical care. What side effects may I notice from receiving this medicine? Side effects that you should report to your doctor or health care professional as soon as possible:  allergic reactions like skin rash, itching or hives, swelling of the face, lips, or tongue  breathing problems  changes in vision  chest pain  confusion  depressed mood  dizziness  feeling faint; lightheaded; falls  general ill feeling or flu-like symptoms  loss of memory  missed menstrual periods  muscle weakness  problems with balance, talking, or walking  signs and symptoms of liver injury like dark yellow or brown urine; general ill feeling or flu-like symptoms; light-colored   stools; loss of appetite; nausea; right upper belly pain; unusually weak or tired; yellowing of the eyes or skin  suicidal thoughts, mood changes  unusual bruising or bleeding  unusually weak or tired Side effects that  usually do not require medical attention (report to your doctor or health care professional if they continue or are bothersome):  headache  joint pain  muscle cramps  muscle pain  nausea, vomiting  pain, redness, or irritation at site where injected  tiredness This list may not describe all possible side effects. Call your doctor for medical advice about side effects. You may report side effects to FDA at 1-800-FDA-1088. Where should I keep my medicine? This drug is given in a hospital or clinic and will not be stored at home. NOTE: This sheet is a summary. It may not cover all possible information. If you have questions about this medicine, talk to your doctor, pharmacist, or health care provider.  2020 Elsevier/Gold Standard (2019-03-15 13:20:26)  

## 2020-07-17 NOTE — Progress Notes (Signed)
PATIENT CARE CENTER NOTE   Diagnosis:  Multiple Sclerosis   Provider: Despina Arias, MD   Procedure: Tysabri infusion    Note: Patient received Tysabri infusion via PIV. Tolerated well with no adverse reaction. Patient declined 1 hour observation post-infusion. Vital signs stable. Discharge instructions given. Patient to come back every 6 weeks for infusion. Alert, oriented and ambulatory at discharge.

## 2020-07-31 ENCOUNTER — Encounter: Payer: Self-pay | Admitting: Neurology

## 2020-07-31 ENCOUNTER — Telehealth: Payer: Self-pay | Admitting: *Deleted

## 2020-07-31 ENCOUNTER — Ambulatory Visit: Payer: Medicaid Other | Admitting: Neurology

## 2020-07-31 VITALS — BP 102/68 | HR 89

## 2020-07-31 DIAGNOSIS — M542 Cervicalgia: Secondary | ICD-10-CM | POA: Diagnosis not present

## 2020-07-31 DIAGNOSIS — R3915 Urgency of urination: Secondary | ICD-10-CM

## 2020-07-31 DIAGNOSIS — G35 Multiple sclerosis: Secondary | ICD-10-CM | POA: Diagnosis not present

## 2020-07-31 DIAGNOSIS — R200 Anesthesia of skin: Secondary | ICD-10-CM | POA: Diagnosis not present

## 2020-07-31 DIAGNOSIS — Z79899 Other long term (current) drug therapy: Secondary | ICD-10-CM | POA: Insufficient documentation

## 2020-07-31 DIAGNOSIS — R269 Unspecified abnormalities of gait and mobility: Secondary | ICD-10-CM | POA: Diagnosis not present

## 2020-07-31 DIAGNOSIS — M791 Myalgia, unspecified site: Secondary | ICD-10-CM

## 2020-07-31 MED ORDER — METHYLPREDNISOLONE 4 MG PO TABS: ORAL_TABLET | ORAL | 0 refills | Status: DC

## 2020-07-31 MED ORDER — GABAPENTIN 300 MG PO CAPS
ORAL_CAPSULE | ORAL | 11 refills | Status: DC
Start: 2020-07-31 — End: 2024-07-13

## 2020-07-31 MED ORDER — BACLOFEN 10 MG PO TABS: ORAL_TABLET | ORAL | 0 refills | Status: DC

## 2020-07-31 NOTE — Telephone Encounter (Signed)
Placed JCV lab in [facility] lock box for routine lab pick up. Results pending. 

## 2020-07-31 NOTE — Progress Notes (Signed)
[facility]  PATIENT: Christie Oconnor DOB: 1986/08/12  REFERRING DOCTOR OR PCP: [facility] SOURCE: Patient, notes from Dr. Malvin Johns.  MRI laboratory reports reviewed.  MRI images personally reviewed. _________________________________   HISTORICAL  CHIEF COMPLAINT:  Chief Complaint  Patient presents with  . Multiple Sclerosis    Room 13. She is currently on Tysabri. She has been experiencing worsening pain in her neck, shoulders and low back.  Reports burning in her feet that wakes her from sleep and makes walking more difficult.     HISTORY OF PRESENT ILLNESS:  Christie Oconnor is a 34 y.o. woman with relapsing remitting multiple sclerosis.  Update 07/31/2020: She is on Tysabri and tolerates it well.  JCV is negative at 0.20 03/17/20.   She denies any exacerbations or new symptoms.   However, about two months ago, she began to experience a lot more neck and shoulder pain.   Symptoms got worse last month.      The pain is a little worse on the right in the trunk/back but worse in the left neck and shoulder vs. Right.    She takes amitriptyline and gabapentin.   They helped her some in the past. But she is not sure now.       MRI of the cervical spine 10/04/2019 showed mild cervical DJD but no nerve root compression or spinal stenosis.   She has several T2 hyperintense foci from MS in her cervical spine   Since the pain has started she is also feeling a little weaker in the legs, worse on the left.  She was walking without any aid but now is using a walker some.  She has also noted some weakness in the left hand that might also be a little bit worse than it was.  She continues to experience some tingling in the arms, left greater than right   She still gets headaches but she has fewer with nortriptyline and they are less intense.   She does not have N/V or photophobia.   She has headache now.     Vit D was low and she takes a weekly high dose  supplement.    She has urinary urgency but would like to hold on a treatment at this time.     MS History  At age 34, she had the onset of left chest numbness in October 2020.  As the day went on, she had tingling in the left arm which built up over the day.    In the past she had had similar episodes that were less intense and only lasting for minutes.   When symptoms persisted, she went to [facility] ED.   She had MRI of the brain showing lesions in the hemispheres.    She was admitted for IV Steroids x 4 or 5 days.   She was referrred to Dr. Malvin Johns who ordered a LP.    CSF showed oligoclonal bands.    She had MRI of the cervical spine showing several lesions in the spinal cord but no enhancement.    Tysabri was initiated in early 2021..       Imaging: 10/04/19 cervical spine MRI showed a left central focus at C2-C3 ; bilateral foci at C4.  No significant DJD.   None enhanced  07/17/2019 brain MRI showed multiple T2 hyperintense foci in hemispheres c/w MS.    Labs: Initial JCV Ab was 0.20 03/17/2020  REVIEW OF SYSTEMS: Constitutional: No fevers, chills, sweats, or  change in appetite Eyes: No visual changes, double vision, eye pain Ear, nose and throat: No hearing loss, ear pain, nasal congestion, sore throat Cardiovascular: No chest pain, palpitations Respiratory: No shortness of breath at rest or with exertion.   No wheezes GastrointestinaI: No nausea, vomiting, diarrhea, abdominal pain, fecal incontinence Genitourinary: No dysuria.  She has urinary urgency and nocturia. Musculoskeletal: No neck pain, back pain Integumentary: No rash, pruritus, skin lesions Neurological: as above Psychiatric: No depression at this time.  No anxiety Endocrine: No palpitations, diaphoresis, change in appetite, change in weigh or increased thirst Hematologic/Lymphatic: No anemia, purpura, petechiae. Allergic/Immunologic: No itchy/runny eyes, nasal congestion, recent allergic reactions,  rashes  ALLERGIES: No Known Allergies  HOME MEDICATIONS:  Current Outpatient Medications:  .  amitriptyline (ELAVIL) 25 MG tablet, Take 25 mg by mouth at bedtime., Disp: , Rfl:  .  gabapentin (NEURONTIN) 300 MG capsule, One po qAM, one po qPM and two po qHS, Disp: 120 capsule, Rfl: 11 .  ibuprofen (ADVIL) 800 MG tablet, Take 1 tablet by mouth as needed. , Disp: , Rfl:  .  natalizumab (TYSABRI) 300 MG/15ML injection, Inject into the vein., Disp: , Rfl:  .  nortriptyline (PAMELOR) 10 MG capsule, Take 1 capsule (10 mg total) by mouth 2 (two) times daily., Disp: 60 capsule, Rfl: 11 .  sertraline (ZOLOFT) 50 MG tablet, Take 50 mg by mouth daily., Disp: , Rfl:  .  Vitamin D, Ergocalciferol, (DRISDOL) 1.25 MG (50000 UNIT) CAPS capsule, Take 1 capsule (50,000 Units total) by mouth every 7 (seven) days., Disp: 13 capsule, Rfl: 4 .  baclofen (LIORESAL) 10 MG tablet, Take 1/2 to 1 pill po tid, Disp: 30 each, Rfl: 0 .  methylPREDNISolone (MEDROL) 4 MG tablet, Taper from 6 pills po for one day to 1 pill po the last day over 6 days, Disp: 21 tablet, Rfl: 0  PAST MEDICAL HISTORY: Past Medical History:  Diagnosis Date  . MS (multiple sclerosis) (HCC)     PAST SURGICAL HISTORY: Past Surgical History:  Procedure Laterality Date  . ANKLE SURGERY Left   . CESAREAN SECTION     x 2  . TUBAL LIGATION      FAMILY HISTORY: Family History  Problem Relation Age of Onset  . Multiple sclerosis Neg Hx     SOCIAL HISTORY:  Social History   Socioeconomic History  . Marital status: Legally Separated    Spouse name: Not on file  . Number of children: 2  . Years of education: Not on file  . Highest education level: Not on file  Occupational History  . Occupation: Unemployed  Tobacco Use  . Smoking status: Current Every Day Smoker  . Smokeless tobacco: Never Used  . Tobacco comment: 6 cigarettes per day  Vaping Use  . Vaping Use: Never used  Substance and Sexual Activity  . Alcohol use: Yes     Comment: occ  . Drug use: Not Currently  . Sexual activity: Not on file  Other Topics Concern  . Not on file  Social History Narrative   Lives with family   Caffeine use:  1/2 gallon tea per day, some soda (usually only drinks when she has headache)   Right handed    Social Determinants of Health   Financial Resource Strain:   . Difficulty of Paying Living Expenses: Not on file  Food Insecurity:   . Worried About Programme researcher, broadcasting/film/video in the Last Year: Not on file  . Ran Out of Food  in the Last Year: Not on file  Transportation Needs:   . Lack of Transportation (Medical): Not on file  . Lack of Transportation (Non-Medical): Not on file  Physical Activity:   . Days of Exercise per Week: Not on file  . Minutes of Exercise per Session: Not on file  Stress:   . Feeling of Stress : Not on file  Social Connections:   . Frequency of Communication with Friends and Family: Not on file  . Frequency of Social Gatherings with Friends and Family: Not on file  . Attends Religious Services: Not on file  . Active Member of Clubs or Organizations: Not on file  . Attends Banker Meetings: Not on file  . Marital Status: Not on file  Intimate Partner Violence:   . Fear of Current or Ex-Partner: Not on file  . Emotionally Abused: Not on file  . Physically Abused: Not on file  . Sexually Abused: Not on file     PHYSICAL EXAM  Vitals:   07/31/20 1343  BP: 102/68  Pulse: 89    There is no height or weight on file to calculate BMI.    General: The patient is well-developed and well-nourished and in no acute distress  HEENT:  Head is Brush/AT.  Sclera are anicteric.    Neck: The neck is tender with reduced ROM   Skin: Extremities are without rash or  Edema.  Musculoskeletal:  Tender over lumbar paraspinal muscles and to a lesser extent over large muscles  Neurologic Exam  Mental status: The patient is alert and oriented x 3 at the time of the examination. The patient has  apparent normal recent and remote memory, with an apparently normal attention span and concentration ability.   Speech is normal.  Cranial nerves: Extraocular movements are full.  . There is good facial sensation to soft touch bilaterally.Facial strength is normal.  Trapezius and sternocleidomastoid strength is normal. No dysarthria is noted.    No obvious hearing deficits are noted.  Motor:  Muscle bulk is normal.   Tone is normal. Strength is  5 / 5 in arms and right leg and 4 to 4+/5 in left leg and arm .  Rapid alternating movements are reduced in the left hand and foot.   Sensory: She has intact sensation to touch and vibration on the right.  She has reduced sensation to  vibration and touch/temp in the left leg and left arm    Coordination: Cerebellar testing reveals good finger-nose-finger and heel-to-shin bilaterally.  Gait and station: Station has wide stance.  Gait is wide and she is unable to do tandem.. Romberg is negative.   Reflexes: Deep tendon reflexes are symmetric in the arms but increased in the left leg relative to the right leg with spread at the knees.  There is no ankle clonus.     DIAGNOSTIC DATA (LABS, IMAGING, TESTING) - I reviewed patient records, labs, notes, testing and imaging myself where available.  Lab Results  Component Value Date   WBC 5.7 05/31/2020   HGB 12.3 05/31/2020   HCT 36.5 05/31/2020   MCV 95.5 05/31/2020   PLT 209 05/31/2020      Component Value Date/Time   NA 138 05/31/2020 1123   NA 137 06/23/2014 1838   K 3.6 05/31/2020 1123   K 3.7 06/23/2014 1838   CL 104 05/31/2020 1123   CL 103 06/23/2014 1838   CO2 25 05/31/2020 1123   CO2 28 06/23/2014 1838  GLUCOSE 96 05/31/2020 1123   GLUCOSE 59 (L) 06/23/2014 1838   BUN 15 05/31/2020 1123   BUN 12 06/23/2014 1838   CREATININE 0.88 05/31/2020 1123   CREATININE 0.74 06/23/2014 1838   CALCIUM 9.0 05/31/2020 1123   CALCIUM 8.5 06/23/2014 1838   PROT 7.1 07/15/2019 2127   PROT 7.0  07/22/2013 1107   ALBUMIN 3.8 07/15/2019 2127   ALBUMIN 3.5 07/22/2013 1107   AST 21 07/15/2019 2127   AST 21 07/22/2013 1107   ALT 15 07/15/2019 2127   ALT 17 07/22/2013 1107   ALKPHOS 38 07/15/2019 2127   ALKPHOS 51 07/22/2013 1107   BILITOT 0.7 07/15/2019 2127   BILITOT 0.5 07/22/2013 1107   GFRNONAA >60 05/31/2020 1123   GFRNONAA >60 06/23/2014 1838   GFRNONAA >60 07/22/2013 1107   GFRAA >60 05/31/2020 1123   GFRAA >60 06/23/2014 1838   GFRAA >60 07/22/2013 1107   Lab Results  Component Value Date   CHOL 168 07/16/2019   HDL 63 07/16/2019   LDLCALC 95 07/16/2019   TRIG 50 07/16/2019   CHOLHDL 2.7 07/16/2019   Lab Results  Component Value Date   HGBA1C 4.8 07/16/2019      ASSESSMENT AND PLAN    Multiple sclerosis (HCC) - Plan: MR CERVICAL SPINE W WO CONTRAST, MR BRAIN W WO CONTRAST, Stratify JCV Antibody Test ([facility]), CBC with Differential/Platelet, Neuromyelitis optica autoab, IgG  Gait disturbance - Plan: Neuromyelitis optica autoab, IgG  Numbness  Neck pain  Urinary urgency  High risk medication use - Plan: Stratify JCV Antibody Test ([facility]), CBC with Differential/Platelet  Myalgia   1.   Unclear whatr is causing more pain and other symptoms.    Not typical for exacerbation.   Continue Tysabri.  We will check JCV antibody and CBC today..  Check MRI of the brain and cervical spine to determine if new lesions are contributing to symptoms.  If so, consider change to Ocrevus.   2.   Continue vitamin D supplementation. 3.   Continue nortriptyline  for frequent migraine headaches. 4.   Medrol dose pack.  Increase gabapentin and start baclofen for pain.   5.   Return in 6 months or sooner for new or worsening neurologic symptoms  45-minute office visit with the majority of the time spent face-to-face for history and physical, discussion/counseling and decision-making.  Additional time with record review and documentation.   Leon Montoya A. Epimenio Foot, MD, [facility]  07/31/2020, 2:12 PM Certified in Neurology, Clinical Neurophysiology, Sleep Medicine and Neuroimaging  [facility] [address], [address] [place], [place] [address] [phone]

## 2020-08-01 ENCOUNTER — Telehealth: Payer: Self-pay | Admitting: Neurology

## 2020-08-01 NOTE — Telephone Encounter (Signed)
mcd healthy blue pending  

## 2020-08-02 LAB — CBC WITH DIFFERENTIAL/PLATELET
Basophils Absolute: 0.1 10*3/uL (ref 0.0–0.2)
Basos: 1 %
EOS (ABSOLUTE): 0.4 10*3/uL (ref 0.0–0.4)
Eos: 4 %
Hematocrit: 38.6 % (ref 34.0–46.6)
Hemoglobin: 13.2 g/dL (ref 11.1–15.9)
Immature Grans (Abs): 0 10*3/uL (ref 0.0–0.1)
Immature Granulocytes: 0 %
Lymphocytes Absolute: 3.4 10*3/uL — ABNORMAL HIGH (ref 0.7–3.1)
Lymphs: 39 %
MCH: 32.1 pg (ref 26.6–33.0)
MCHC: 34.2 g/dL (ref 31.5–35.7)
MCV: 94 fL (ref 79–97)
Monocytes Absolute: 0.6 10*3/uL (ref 0.1–0.9)
Monocytes: 7 %
Neutrophils Absolute: 4.3 10*3/uL (ref 1.4–7.0)
Neutrophils: 49 %
Platelets: 266 10*3/uL (ref 150–450)
RBC: 4.11 x10E6/uL (ref 3.77–5.28)
RDW: 13.7 % (ref 11.7–15.4)
WBC: 8.8 10*3/uL (ref 3.4–10.8)

## 2020-08-02 LAB — NEUROMYELITIS OPTICA AUTOAB, IGG: NMO IgG Autoantibodies: <1.5 U/mL (ref 0.0–3.0)

## 2020-08-02 LAB — CK: Total CK: 132 U/L (ref 32–182)

## 2020-08-07 NOTE — Telephone Encounter (Signed)
JCV ab drawn on 07/31/20 indeterminate, index: 0.25. Inhibition assay: negative. Gave to MD for review.

## 2020-08-07 NOTE — Telephone Encounter (Signed)
mcd healthy blue Berkley Harvey: [id number] (exp. 08/01/20 to 09/29/20) & [id number] (exp. 08/01/20 to 09/29/20) order sent to GI. They will reach out to the patient to schedule.

## 2020-08-28 ENCOUNTER — Other Ambulatory Visit: Payer: Self-pay

## 2020-08-28 ENCOUNTER — Ambulatory Visit (HOSPITAL_COMMUNITY)
Admission: RE | Admit: 2020-08-28 | Discharge: 2020-08-28 | Disposition: A | Discharge status: Home / Self Care | Payer: Medicaid Other | Source: Ambulatory Visit | Attending: Internal Medicine | Admitting: Internal Medicine

## 2020-08-28 DIAGNOSIS — G35 Multiple sclerosis: Secondary | ICD-10-CM | POA: Diagnosis not present

## 2020-08-28 MED ORDER — SODIUM CHLORIDE 0.9 % IV SOLN
300.0000 mg | INTRAVENOUS | Status: DC
  Administered 2020-08-28: 300 mg via INTRAVENOUS
  Filled 2020-08-28: qty 15

## 2020-08-28 MED ORDER — SODIUM CHLORIDE 0.9 % IV SOLN
INTRAVENOUS | Status: DC | PRN
  Administered 2020-08-28: 250 mL via INTRAVENOUS

## 2020-08-28 NOTE — Discharge Instructions (Signed)
Natalizumab injection What is this medicine? NATALIZUMAB (na ta LIZ you mab) is used to treat relapsing multiple sclerosis. This drug is not a cure. It is also used to treat Crohn's disease. This medicine may be used for other purposes; ask your health care provider or pharmacist if you have questions. COMMON BRAND NAME(S): Tysabri What should I tell my health care provider before I take this medicine? They need to know if you have any of these conditions:  immune system problems  progressive multifocal leukoencephalopathy (PML)  an unusual or allergic reaction to natalizumab, other medicines, foods, dyes, or preservatives  pregnant or trying to get pregnant  breast-feeding How should I use this medicine? This medicine is for infusion into a vein. It is given by a health care professional in a hospital or clinic setting. A special MedGuide will be given to you by the pharmacist with each prescription and refill. Be sure to read this information carefully each time. Talk to your pediatrician regarding the use of this medicine in children. This medicine is not approved for use in children. Overdosage: If you think you have taken too much of this medicine contact a poison control center or emergency room at once. NOTE: This medicine is only for you. Do not share this medicine with others. What if I miss a dose? It is important not to miss your dose. Call your doctor or health care professional if you are unable to keep an appointment. What may interact with this medicine? Do not take this medicine with any of the following medications:  biologic medicines such as adalimumab, certolizumab, etanercept, golimumab, infliximab This medicine may also interact with the following medications:  azathioprine  cyclosporine  interferons  6-mercaptopurine  methotrexate  other medicines that lower your chance of fighting an infection  steroid medicines like prednisone or  cortisone  vaccines This list may not describe all possible interactions. Give your health care provider a list of all the medicines, herbs, non-prescription drugs, or dietary supplements you use. Also tell them if you smoke, drink alcohol, or use illegal drugs. Some items may interact with your medicine. What should I watch for while using this medicine? Your condition will be monitored carefully while you are receiving this medicine. Visit your doctor for regular check ups. Tell your doctor or healthcare professional if your symptoms do not start to get better or if they get worse. Stay away from people who are sick. Call your doctor or health care professional for advice if you get a fever, chills or sore throat, or other symptoms of a cold or flu. Do not treat yourself. In some patients, this medicine may cause a serious brain infection that may cause death. If you have any problems seeing, thinking, speaking, walking, or standing, tell your doctor right away. If you cannot reach your doctor, get urgent medical care. What side effects may I notice from receiving this medicine? Side effects that you should report to your doctor or health care professional as soon as possible:  allergic reactions like skin rash, itching or hives, swelling of the face, lips, or tongue  breathing problems  changes in vision  chest pain  confusion  depressed mood  dizziness  feeling faint; lightheaded; falls  general ill feeling or flu-like symptoms  loss of memory  missed menstrual periods  muscle weakness  problems with balance, talking, or walking  signs and symptoms of liver injury like dark yellow or brown urine; general ill feeling or flu-like symptoms; light-colored   stools; loss of appetite; nausea; right upper belly pain; unusually weak or tired; yellowing of the eyes or skin  suicidal thoughts, mood changes  unusual bruising or bleeding  unusually weak or tired Side effects that  usually do not require medical attention (report to your doctor or health care professional if they continue or are bothersome):  headache  joint pain  muscle cramps  muscle pain  nausea, vomiting  pain, redness, or irritation at site where injected  tiredness This list may not describe all possible side effects. Call your doctor for medical advice about side effects. You may report side effects to FDA at 1-800-FDA-1088. Where should I keep my medicine? This drug is given in a hospital or clinic and will not be stored at home. NOTE: This sheet is a summary. It may not cover all possible information. If you have questions about this medicine, talk to your doctor, pharmacist, or health care provider.  2020 Elsevier/Gold Standard (2019-03-15 13:20:26)  

## 2020-08-28 NOTE — Progress Notes (Signed)
PATIENT CARE CENTER NOTE   Diagnosis:  Multiple Sclerosis   Provider: Sater, Richard, MD   Procedure: Tysabri infusion    Note: Patient received Tysabri infusion via PIV. Tolerated well with no adverse reaction. Patient declined 1 hour observation post-infusion. Vital signs stable. Discharge instructions given. Patient to come back every 6 weeks for infusion. Alert, oriented and ambulatory at discharge.   

## 2020-08-30 ENCOUNTER — Ambulatory Visit
Admission: RE | Admit: 2020-08-30 | Discharge: 2020-08-30 | Disposition: A | Discharge status: Home / Self Care | Payer: Medicaid Other | Source: Ambulatory Visit | Attending: Neurology | Admitting: Neurology

## 2020-08-30 ENCOUNTER — Other Ambulatory Visit: Payer: Self-pay

## 2020-08-30 DIAGNOSIS — G35 Multiple sclerosis: Secondary | ICD-10-CM

## 2020-08-30 DIAGNOSIS — G35D Multiple sclerosis, unspecified: Secondary | ICD-10-CM

## 2020-08-30 MED ORDER — GADOBENATE DIMEGLUMINE 529 MG/ML IV SOLN
15.0000 mL | Freq: Once | INTRAVENOUS | Status: AC | PRN
  Administered 2020-08-30: 15 mL via INTRAVENOUS

## 2020-09-18 ENCOUNTER — Ambulatory Visit: Payer: Medicaid Other | Admitting: Neurology

## 2020-09-27 ENCOUNTER — Ambulatory Visit: Payer: Medicaid Other | Admitting: Neurology

## 2020-10-09 ENCOUNTER — Encounter (HOSPITAL_COMMUNITY): Payer: Medicaid Other

## 2020-10-20 ENCOUNTER — Emergency Department: Payer: Medicaid Other

## 2020-10-20 ENCOUNTER — Other Ambulatory Visit: Payer: Self-pay

## 2020-10-20 ENCOUNTER — Emergency Department
Admission: EM | Admit: 2020-10-20 | Discharge: 2020-10-20 | Disposition: A | Discharge status: Home / Self Care | Payer: Medicaid Other | Attending: Emergency Medicine | Admitting: Emergency Medicine

## 2020-10-20 DIAGNOSIS — M25552 Pain in left hip: Secondary | ICD-10-CM | POA: Insufficient documentation

## 2020-10-20 DIAGNOSIS — Z87891 Personal history of nicotine dependence: Secondary | ICD-10-CM | POA: Insufficient documentation

## 2020-10-20 DIAGNOSIS — M545 Low back pain, unspecified: Secondary | ICD-10-CM | POA: Diagnosis not present

## 2020-10-20 DIAGNOSIS — M79652 Pain in left thigh: Secondary | ICD-10-CM | POA: Insufficient documentation

## 2020-10-20 DIAGNOSIS — M25512 Pain in left shoulder: Secondary | ICD-10-CM | POA: Diagnosis not present

## 2020-10-20 DIAGNOSIS — Y92410 Unspecified street and highway as the place of occurrence of the external cause: Secondary | ICD-10-CM | POA: Diagnosis not present

## 2020-10-20 DIAGNOSIS — M7918 Myalgia, other site: Secondary | ICD-10-CM

## 2020-10-20 MED ORDER — CYCLOBENZAPRINE HCL 5 MG PO TABS: 5.0000 mg | ORAL_TABLET | Freq: Three times a day (TID) | ORAL | 0 refills | Status: DC | PRN

## 2020-10-20 MED ORDER — HYDROCODONE-ACETAMINOPHEN 5-325 MG PO TABS
1.0000 | ORAL_TABLET | Freq: Once | ORAL | Status: AC
  Administered 2020-10-20: 1 via ORAL
  Filled 2020-10-20: qty 1

## 2020-10-20 MED ORDER — CYCLOBENZAPRINE HCL 10 MG PO TABS
10.0000 mg | ORAL_TABLET | Freq: Once | ORAL | Status: AC
  Administered 2020-10-20: 10 mg via ORAL
  Filled 2020-10-20: qty 1

## 2020-10-20 MED ORDER — LIDOCAINE 5 % EX PTCH: 1.0000 | MEDICATED_PATCH | Freq: Two times a day (BID) | CUTANEOUS | 0 refills | Status: AC | PRN

## 2020-10-20 MED ORDER — LIDOCAINE 5 % EX PTCH: 1.0000 | MEDICATED_PATCH | Freq: Two times a day (BID) | CUTANEOUS | 0 refills | Status: DC | PRN
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
## 2020-10-20 NOTE — ED Notes (Signed)
Provider with pt att discussing home treatment and pain interventions  Water and apple juice provided
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
## 2020-12-11 ENCOUNTER — Telehealth: Payer: Self-pay | Admitting: *Deleted

## 2020-12-11 ENCOUNTER — Telehealth: Payer: Self-pay | Admitting: Neurology

## 2020-12-11 NOTE — Telephone Encounter (Addendum)
Liane in the infusion suite got message from pt that she is unable to reach [facility] to get later appt time. I called [facility] and r/s for 12/15/20 at 10am.  I called pt to let her know. She verbalized understanding and appreciation.
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
## 2020-12-11 NOTE — Telephone Encounter (Signed)
Faxed completed/signed Tysabri pt status report and reauth questionnaire to [facility] at [phone]. Received confirmation.   Received fax from touch prescribing program that patient re-authorized from 12/11/20-07/13/21. Patient enrollment number: [id number]. Account: [facility]. Site auth number: [id number]

## 2020-12-11 NOTE — Telephone Encounter (Addendum)
Called pt back. Advised she was getting her Tysabri infusions at [facility]. She verified she has been trying to call them. Goes to VM, she leaves message and has not received call back. Confirmed she had correct phone #. Advised I will call and make appt and call her back. I called and spoke with Nelva Bush. Made appt for pt on 12/14/20 at 8am. I called pt. She states this is too early. She will call them back and r/s for later time. She will call me back if she runs into any issues. Confirmed she still has Medicaid insurance. I faxed new orders over to them at [phone]. Received fax confirmation. Pt next follow up here is 01/29/21.

## 2020-12-14 ENCOUNTER — Encounter (HOSPITAL_COMMUNITY): Payer: Medicaid Other

## 2020-12-15 ENCOUNTER — Non-Acute Institutional Stay (HOSPITAL_COMMUNITY)
Admission: RE | Admit: 2020-12-15 | Discharge: 2020-12-15 | Disposition: A | Discharge status: Home / Self Care | Payer: Medicaid Other | Source: Ambulatory Visit | Attending: Internal Medicine | Admitting: Internal Medicine

## 2020-12-15 ENCOUNTER — Other Ambulatory Visit: Payer: Self-pay

## 2020-12-15 DIAGNOSIS — G35 Multiple sclerosis: Secondary | ICD-10-CM | POA: Insufficient documentation

## 2020-12-15 MED ORDER — SODIUM CHLORIDE 0.9 % IV SOLN
INTRAVENOUS | Status: DC | PRN
  Administered 2020-12-15: 250 mL via INTRAVENOUS

## 2020-12-15 MED ORDER — SODIUM CHLORIDE 0.9 % IV SOLN
300.0000 mg | INTRAVENOUS | Status: DC
  Administered 2020-12-15: 300 mg via INTRAVENOUS
  Filled 2020-12-15: qty 15

## 2020-12-15 NOTE — Progress Notes (Signed)
Patient received IV Tysabri as ordered by Despina Arias, MD. Declined post infusion 60 minutes observation.Tolerated well, vitals stable, discharge instructions given, verbalized understanding. Alert, oriented and ambulatory at the time of discharge.

## 2020-12-15 NOTE — Discharge Instructions (Signed)
Natalizumab injection What is this medicine? NATALIZUMAB (na ta LIZ you mab) is used to treat relapsing multiple sclerosis. This drug is not a cure. It is also used to treat Crohn's disease. This medicine may be used for other purposes; ask your health care provider or pharmacist if you have questions. COMMON BRAND NAME(S): Tysabri What should I tell my health care provider before I take this medicine? They need to know if you have any of these conditions:  immune system problems  progressive multifocal leukoencephalopathy (PML)  an unusual or allergic reaction to natalizumab, other medicines, foods, dyes, or preservatives  pregnant or trying to get pregnant  breast-feeding How should I use this medicine? This medicine is for infusion into a vein. It is given by a health care professional in a hospital or clinic setting. A special MedGuide will be given to you by the pharmacist with each prescription and refill. Be sure to read this information carefully each time. Talk to your pediatrician regarding the use of this medicine in children. This medicine is not approved for use in children. Overdosage: If you think you have taken too much of this medicine contact a poison control center or emergency room at once. NOTE: This medicine is only for you. Do not share this medicine with others. What if I miss a dose? It is important not to miss your dose. Call your doctor or health care professional if you are unable to keep an appointment. What may interact with this medicine? Do not take this medicine with any of the following medications:  biologic medicines such as adalimumab, certolizumab, etanercept, golimumab, infliximab This medicine may also interact with the following medications:  azathioprine  cyclosporine  interferons  6-mercaptopurine  methotrexate  other medicines that lower your chance of fighting an infection  steroid medicines like prednisone or  cortisone  vaccines This list may not describe all possible interactions. Give your health care provider a list of all the medicines, herbs, non-prescription drugs, or dietary supplements you use. Also tell them if you smoke, drink alcohol, or use illegal drugs. Some items may interact with your medicine. What should I watch for while using this medicine? Your condition will be monitored carefully while you are receiving this medicine. Visit your doctor for regular check ups. Tell your doctor or healthcare professional if your symptoms do not start to get better or if they get worse. Stay away from people who are sick. Call your doctor or health care professional for advice if you get a fever, chills or sore throat, or other symptoms of a cold or flu. Do not treat yourself. In some patients, this medicine may cause a serious brain infection that may cause death. If you have any problems seeing, thinking, speaking, walking, or standing, tell your doctor right away. If you cannot reach your doctor, get urgent medical care. What side effects may I notice from receiving this medicine? Side effects that you should report to your doctor or health care professional as soon as possible:  allergic reactions like skin rash, itching or hives, swelling of the face, lips, or tongue  breathing problems  changes in vision  chest pain  confusion  depressed mood  dizziness  feeling faint; lightheaded; falls  general ill feeling or flu-like symptoms  loss of memory  missed menstrual periods  muscle weakness  problems with balance, talking, or walking  signs and symptoms of liver injury like dark yellow or brown urine; general ill feeling or flu-like symptoms; light-colored   stools; loss of appetite; nausea; right upper belly pain; unusually weak or tired; yellowing of the eyes or skin  suicidal thoughts, mood changes  unusual bruising or bleeding  unusually weak or tired Side effects that  usually do not require medical attention (report to your doctor or health care professional if they continue or are bothersome):  headache  joint pain  muscle cramps  muscle pain  nausea, vomiting  pain, redness, or irritation at site where injected  tiredness This list may not describe all possible side effects. Call your doctor for medical advice about side effects. You may report side effects to FDA at 1-800-FDA-1088. Where should I keep my medicine? This drug is given in a hospital or clinic and will not be stored at home. NOTE: This sheet is a summary. It may not cover all possible information. If you have questions about this medicine, talk to your doctor, pharmacist, or health care provider.  2021 Elsevier/Gold Standard (2019-03-15 13:20:26)  

## 2021-01-26 ENCOUNTER — Encounter (HOSPITAL_COMMUNITY): Payer: Medicaid Other

## 2021-01-29 ENCOUNTER — Ambulatory Visit: Payer: Medicaid Other | Admitting: Neurology

## 2021-01-29 ENCOUNTER — Encounter: Payer: Self-pay | Admitting: Neurology

## 2021-01-31 ENCOUNTER — Inpatient Hospital Stay (HOSPITAL_COMMUNITY): Admission: RE | Admit: 2021-01-31 | Payer: Medicaid Other | Source: Ambulatory Visit

## 2021-02-28 ENCOUNTER — Telehealth: Payer: Self-pay | Admitting: Neurology

## 2021-02-28 NOTE — Telephone Encounter (Addendum)
LVM at [phone] for pt to call office back.  Called [phone]. LVM for pt to call office back.  Tried mother # on [place]: [phone]. Wrong #.  Tried [phone] on DPR. Received automated message that call could not be completed and to hang up and try again later. Tried Mr. Hinton Rao at [phone] (on [place]). Went to VM. Mailbox full, unable to LVM.

## 2021-02-28 NOTE — Telephone Encounter (Signed)
Michelle @ [employer] is asking for a all to discuss the pt's status.  Marcelino Duster says pt has not had the medication since March, please call.

## 2021-02-28 NOTE — Telephone Encounter (Signed)
Called Michelle back w/ [employer] and LVM at Tel: [phone] ext  [phone]. Advised we have been unable to reach patient so far via phone. Has no showed appt/infusion. I have sent email as well to pt.Waiting to hear back.

## 2021-03-01 ENCOUNTER — Encounter: Payer: Self-pay | Admitting: *Deleted

## 2021-03-01 NOTE — Telephone Encounter (Signed)
Sent pt letter

## 2021-04-06 ENCOUNTER — Emergency Department (HOSPITAL_COMMUNITY)
Admission: EM | Admit: 2021-04-06 | Discharge: 2021-04-06 | Disposition: A | Discharge status: Home / Self Care | Payer: Medicaid Other | Attending: Emergency Medicine | Admitting: Emergency Medicine

## 2021-04-06 ENCOUNTER — Encounter (HOSPITAL_COMMUNITY): Payer: Self-pay | Admitting: *Deleted

## 2021-04-06 ENCOUNTER — Other Ambulatory Visit: Payer: Self-pay

## 2021-04-06 DIAGNOSIS — Z87891 Personal history of nicotine dependence: Secondary | ICD-10-CM | POA: Insufficient documentation

## 2021-04-06 DIAGNOSIS — R102 Pelvic and perineal pain: Secondary | ICD-10-CM | POA: Diagnosis not present

## 2021-04-06 HISTORY — DX: Endometriosis, unspecified: N80.9

## 2021-04-06 LAB — URINALYSIS, ROUTINE W REFLEX MICROSCOPIC
Bilirubin Urine: NEGATIVE
Glucose, UA: NEGATIVE mg/dL
Hgb urine dipstick: NEGATIVE
Ketones, ur: NEGATIVE mg/dL
Leukocytes,Ua: NEGATIVE
Nitrite: NEGATIVE
Protein, ur: NEGATIVE mg/dL
Specific Gravity, Urine: 1.008 (ref 1.005–1.030)
pH: 6 (ref 5.0–8.0)

## 2021-04-06 LAB — PREGNANCY, URINE: Preg Test, Ur: NEGATIVE

## 2021-04-06 MED ORDER — ACETAMINOPHEN 325 MG PO TABS
650.0000 mg | ORAL_TABLET | Freq: Once | ORAL | Status: AC
  Administered 2021-04-06: 650 mg via ORAL
  Filled 2021-04-06: qty 2

## 2021-04-06 NOTE — ED Triage Notes (Signed)
Abdominal pain x 1.5 hours

## 2021-04-06 NOTE — ED Notes (Signed)
Also c/o vaginal bleeding

## 2021-04-06 NOTE — ED Provider Notes (Signed)
[facility] EMERGENCY DEPARTMENT Provider Note  CSN: [id number] Arrival date & time: 04/06/21 1314    History Chief Complaint  Patient presents with   Abdominal Pain    Christie Oconnor is a 35 y.o. female presents for evaluation of pelvic cramping, onset about 1-2 hours prior to arrival, associated with vaginal spotting, but no dysuria or vaginal discharge. She is concerned because she had similar pain previously with an ectopic pregnancy. She has had tubal ligation. She does not think it is time for her menses, but she also states her LMP was about a month ago and she is not regular at baseline.    Past Medical History:  Diagnosis Date   Endometriosis    MS (multiple sclerosis) (HCC)    MS (multiple sclerosis) (HCC)     Past Surgical History:  Procedure Laterality Date   ANKLE SURGERY Left    CESAREAN SECTION     x 2   TUBAL LIGATION      Family History  Problem Relation Age of Onset   Multiple sclerosis Neg Hx     Social History   Tobacco Use   Smoking status: Former   Smokeless tobacco: Never   Tobacco comments:    6 cigarettes per day  Vaping Use   Vaping Use: Never used  Substance Use Topics   Alcohol use: Not Currently    Comment: occ   Drug use: Not Currently     Home Medications Prior to Admission medications   Medication Sig Start Date End Date Taking? Authorizing Provider  amitriptyline (ELAVIL) 25 MG tablet Take 25 mg by mouth at bedtime.    [provider]  cyclobenzaprine (FLEXERIL) 5 MG tablet Take 1 tablet (5 mg total) by mouth 3 (three) times daily as needed. 10/20/20   Menshew, Charlesetta Ivory, PA-C  gabapentin (NEURONTIN) 300 MG capsule One po qAM, one po qPM and two po qHS 07/31/20   Sater, Pearletha Furl, MD  ibuprofen (ADVIL) 800 MG tablet Take 1 tablet by mouth as needed.  10/28/15   [provider]  natalizumab (TYSABRI) 300 MG/15ML injection Inject into the vein.    [provider]  nortriptyline (PAMELOR) 10  MG capsule Take 1 capsule (10 mg total) by mouth 2 (two) times daily. 03/17/20   Sater, Pearletha Furl, MD  sertraline (ZOLOFT) 50 MG tablet Take 50 mg by mouth daily. 05/15/20   [provider]  Vitamin D, Ergocalciferol, (DRISDOL) 1.25 MG (50000 UNIT) CAPS capsule Take 1 capsule (50,000 Units total) by mouth every 7 (seven) days. 12/22/19   Sater, Pearletha Furl, MD     Allergies    Patient has no known allergies.   Review of Systems   Review of Systems A comprehensive review of systems was completed and negative except as noted in HPI.    Physical Exam BP 110/82 (BP Location: Right Arm)   Pulse 75   Temp 98.4 F (36.9 C) (Oral)   Resp 18   Ht 5\' 7"  (1.702 m)   Wt 78 kg   LMP 03/15/2021 (Approximate)   SpO2 97%   BMI 26.95 kg/m   Physical Exam Vitals and nursing note reviewed.  Constitutional:      Appearance: Normal appearance.  HENT:     Head: Normocephalic and atraumatic.     Nose: Nose normal.     Mouth/Throat:     Mouth: Mucous membranes are moist.  Eyes:     Extraocular Movements: Extraocular movements intact.  Conjunctiva/sclera: Conjunctivae normal.  Cardiovascular:     Rate and Rhythm: Normal rate.  Pulmonary:     Effort: Pulmonary effort is normal.     Breath sounds: Normal breath sounds.  Abdominal:     General: Abdomen is flat.     Palpations: Abdomen is soft.     Tenderness: There is no abdominal tenderness. There is no guarding. Negative signs include Murphy's sign and McBurney's sign.  Musculoskeletal:        General: No swelling. Normal range of motion.     Cervical back: Neck supple.  Skin:    General: Skin is warm and dry.  Neurological:     General: No focal deficit present.     Mental Status: She is alert.  Psychiatric:        Mood and Affect: Mood normal.     ED Results / Procedures / Treatments   Labs (all labs ordered are listed, but only abnormal results are displayed) Labs Reviewed  URINALYSIS, ROUTINE W REFLEX MICROSCOPIC -  Abnormal; Notable for the following components:      Result Value   Color, Urine STRAW (*)    All other components within normal limits  PREGNANCY, URINE    EKG None  Radiology No results found.  Procedures Procedures  Medications Ordered in the ED Medications  acetaminophen (TYLENOL) tablet 650 mg (has no administration in time range)     MDM Rules/Calculators/A&P MDM Patient reassured her pregnancy and UA are normal. Abdomen is benign. She declines any additional ED workup. Requests APAP for pain. Will return if symptoms worsen or for any other concerns.   ED Course  I have reviewed the triage vital signs and the nursing notes.  Pertinent labs & imaging results that were available during my care of the patient were reviewed by me and considered in my medical decision making (see chart for details).     Final Clinical Impression(s) / ED Diagnoses Final diagnoses:  Pelvic pain    Rx / DC Orders ED Discharge Orders     None        Pollyann Savoy, MD 04/06/21 [id number]

## 2021-04-06 NOTE — ED Provider Notes (Signed)
Emergency Medicine Provider Triage Evaluation Note  Christie Oconnor , a 35 y.o. female  was evaluated in triage.  Pt complains of spotting and lower abdominal pain.  Pt has had a previous ectopic and is worried   Review of Systems  Positive: Abdominal pain  Negative: fever  Physical Exam  BP 110/82 (BP Location: Right Arm)   Pulse 75   Temp 98.4 F (36.9 C) (Oral)   Resp 18   Ht 5\' 7"  (1.702 m)   Wt 78 kg   LMP 03/15/2021 (Approximate)   SpO2 97%   BMI 26.95 kg/m  Gen:   Awake, no distress   Resp:  Normal effort  MSK:   Moves extremities without difficulty  Other:    Medical Decision Making  Medically screening exam initiated at 1:58 PM.  Appropriate orders placed.  JERRI GLAUSER was informed that the remainder of the evaluation will be completed by another provider, this initial triage assessment does not replace that evaluation, and the importance of remaining in the ED until their evaluation is complete.     Marigene Ehlers 04/06/21 1359    04/08/21, MD 04/06/21 2031

## 2021-04-20 IMAGING — CT CT HEAD W/O CM
3 series · 16 of 47 positions shown, 19 images · non-contrast
Comparison: CT 07/15/2019, MRI 07/16/2019

CLINICAL DATA: Headache, worsening.  Multiple sclerosis.

EXAM:
CT HEAD WITHOUT CONTRAST
TECHNIQUE: Contiguous axial images were obtained from the base of the skull
through the vertex without intravenous contrast.

[Series 2: head w o · axial · 0.51mm/px · z∈[-42,+83]mm · 10 of 31 slices shown, 13 images]
[im 3/31  brain]
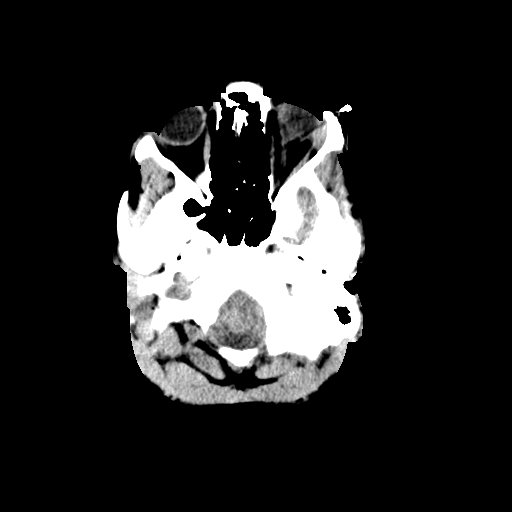
[im 3/31  bone]
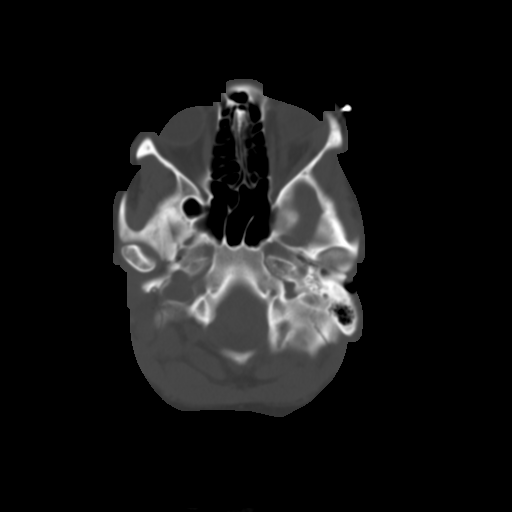
[im 6/31  brain]
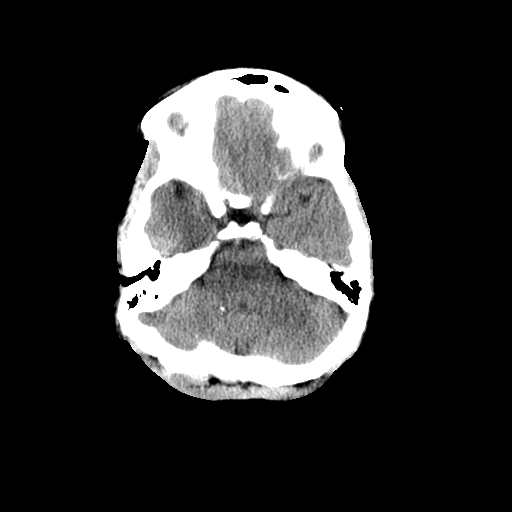
[im 9/31  brain]
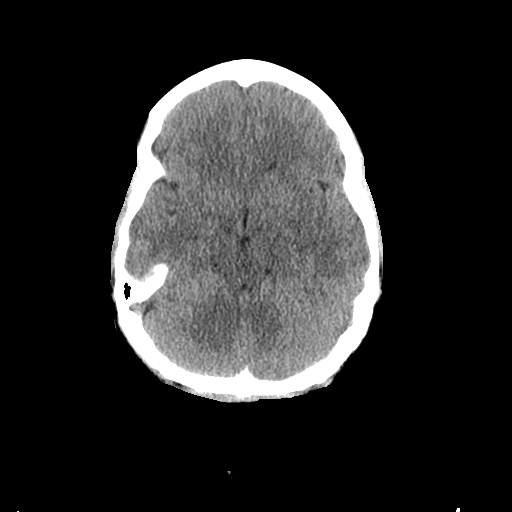
[im 11/31  brain]
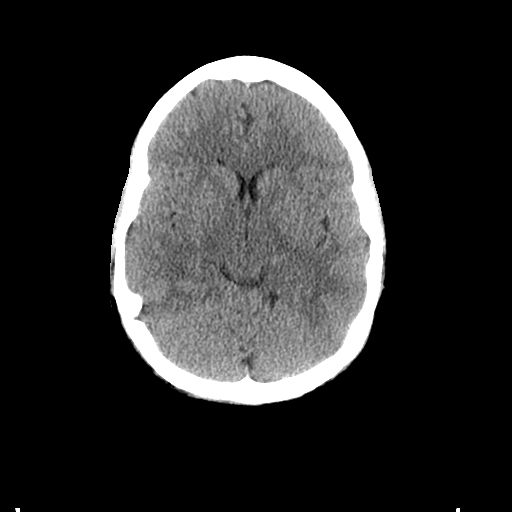
[im 14/31  brain]
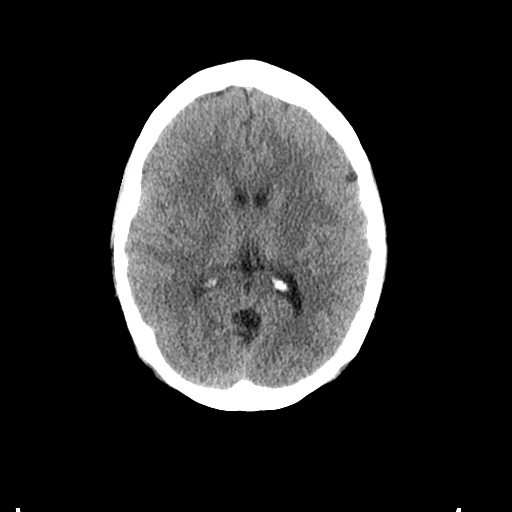
[im 14/31  bone]
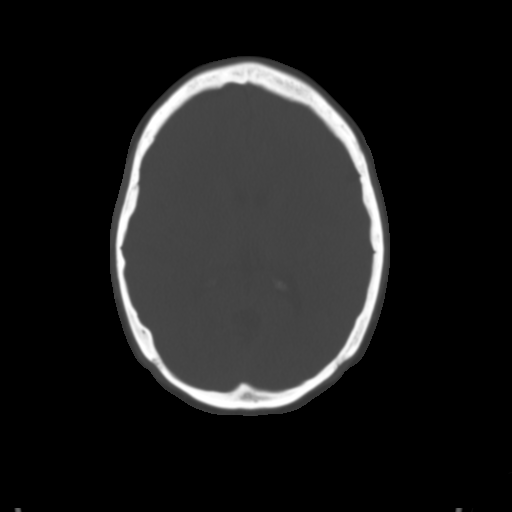
[im 17/31  brain]
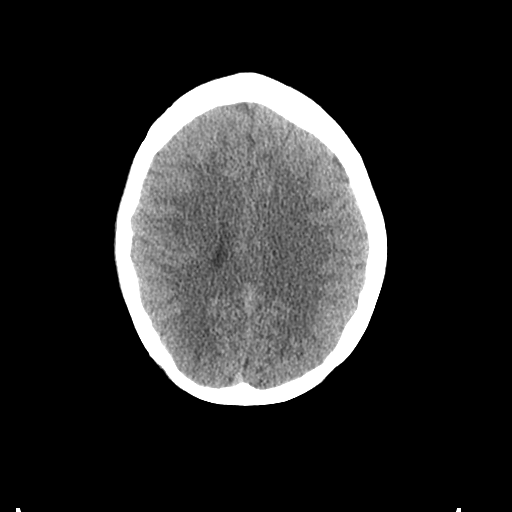
[im 20/31  brain]
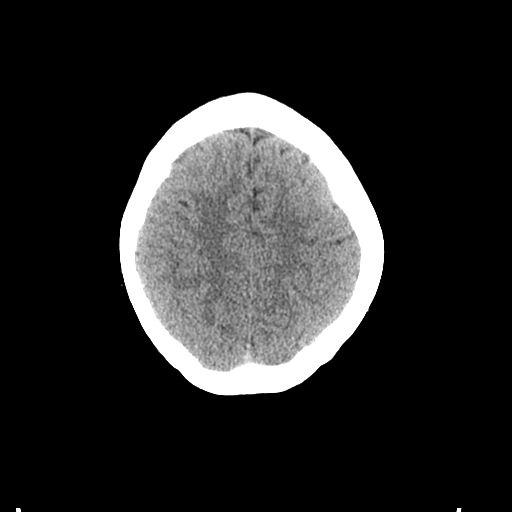
[im 23/31  brain]
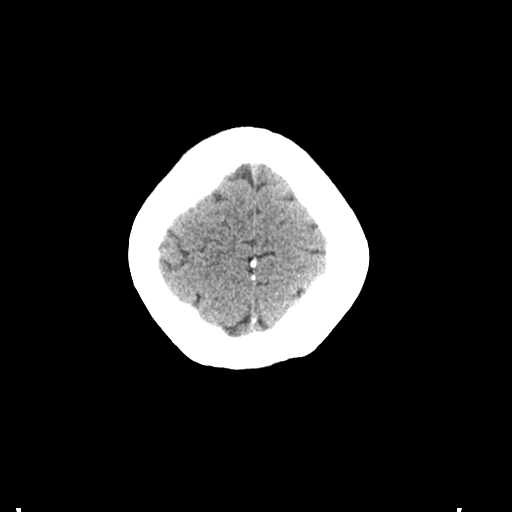
[im 25/31  brain]
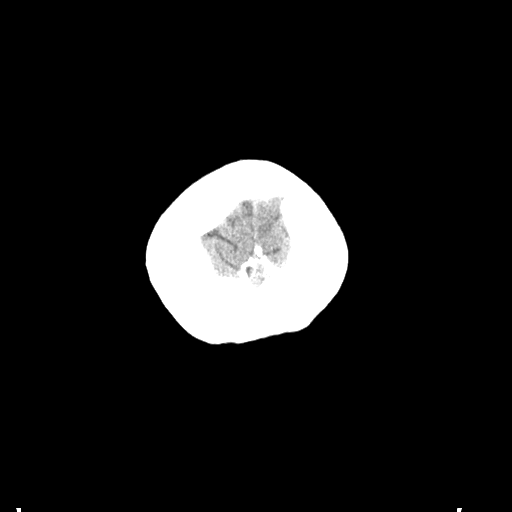
[im 25/31  bone]
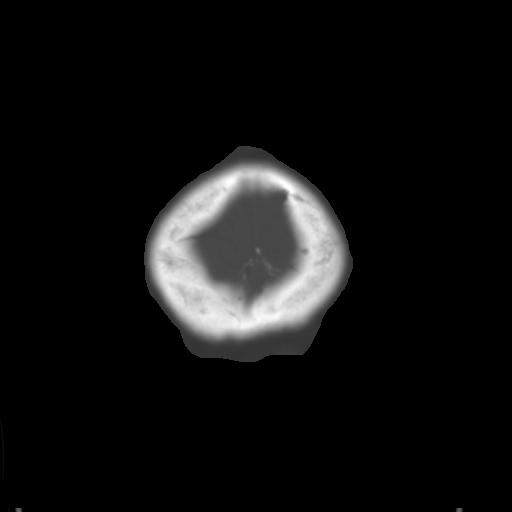
[im 28/31  brain]
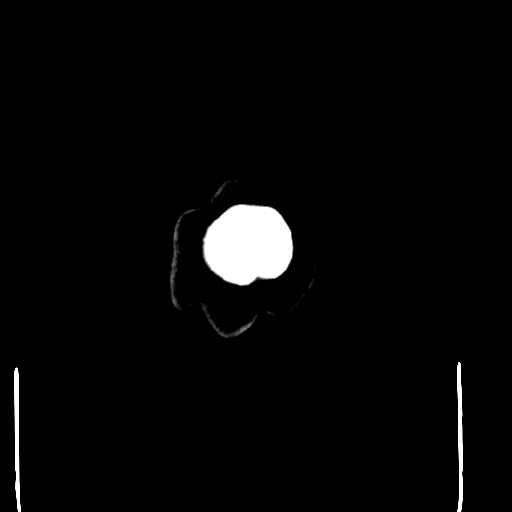

[Series 4: coronal soft · coronal · 0.33mm/px · 3 of 71 slices shown]
[im 24/71  brain]
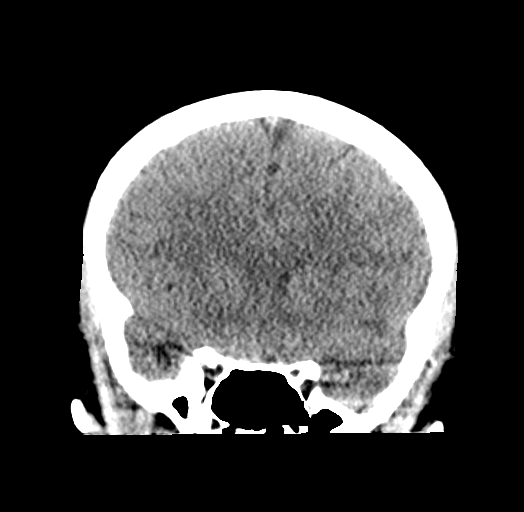
[im 32/71  brain]
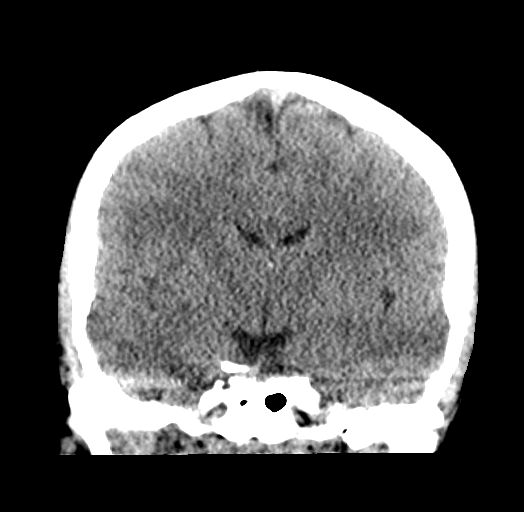
[im 39/71  brain]
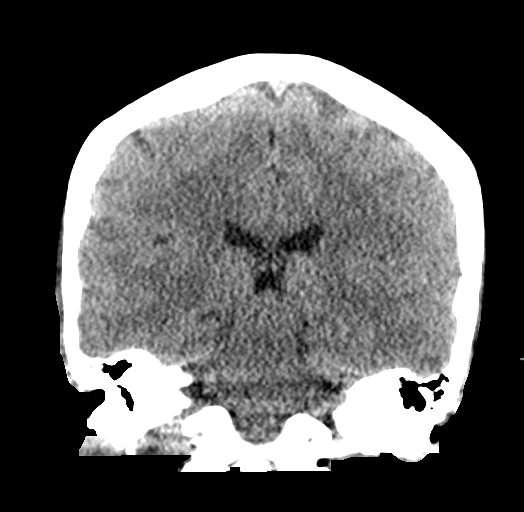

[Series 5: sagittal soft · sagittal · 0.33mm/px · 3 of 58 slices shown]
[im 20/58  brain]
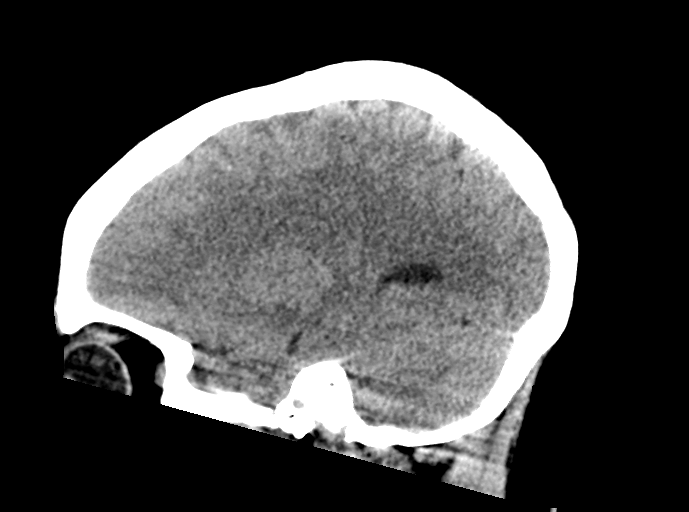
[im 29/58  brain]
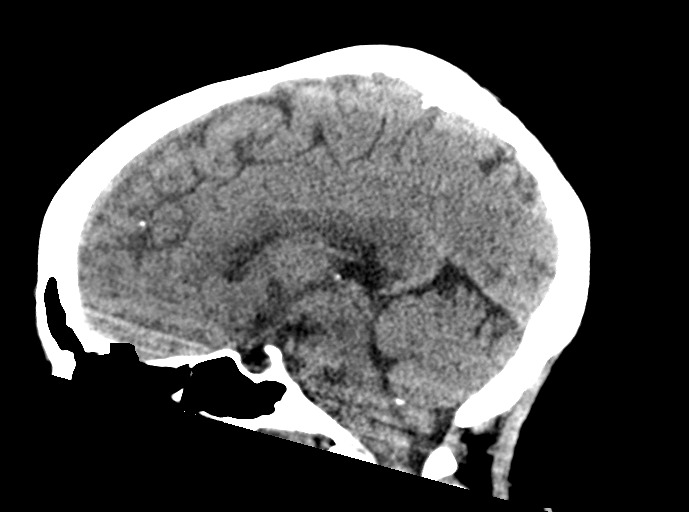
[im 39/58  brain]
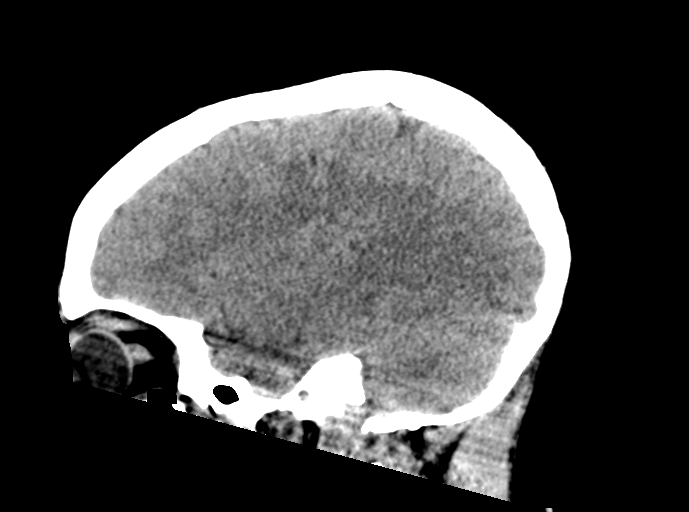

[16 of 47 positions shown; findings below may reference images not displayed]

FINDINGS: Brain: No evidence of acute large vascular territory infarction,
acute hemorrhage, hydrocephalus, extra-axial collection or mass
lesion/mass effect.

Vascular: No hyperdense vessel or unexpected calcification.

Skull: Normal. Negative for fracture or focal lesion.

Sinuses/Orbits: No acute finding.

Other: No mastoid effusions.
IMPRESSION: 1. No evidence of acute intracranial abnormality.
2. MRI could better evaluate for demyelination in this patient with
reported multiple sclerosis, if clinically indicated.

## 2021-05-19 ENCOUNTER — Emergency Department: Payer: Medicaid Other

## 2021-05-19 ENCOUNTER — Emergency Department
Admission: EM | Admit: 2021-05-19 | Discharge: 2021-05-19 | Disposition: A | Discharge status: Home / Self Care | Payer: Medicaid Other | Attending: Emergency Medicine | Admitting: Emergency Medicine

## 2021-05-19 DIAGNOSIS — G35 Multiple sclerosis: Secondary | ICD-10-CM | POA: Insufficient documentation

## 2021-05-19 DIAGNOSIS — R1030 Lower abdominal pain, unspecified: Secondary | ICD-10-CM | POA: Insufficient documentation

## 2021-05-19 DIAGNOSIS — Z20822 Contact with and (suspected) exposure to covid-19: Secondary | ICD-10-CM | POA: Diagnosis not present

## 2021-05-19 DIAGNOSIS — R0781 Pleurodynia: Secondary | ICD-10-CM | POA: Insufficient documentation

## 2021-05-19 DIAGNOSIS — M6281 Muscle weakness (generalized): Secondary | ICD-10-CM | POA: Insufficient documentation

## 2021-05-19 DIAGNOSIS — R202 Paresthesia of skin: Secondary | ICD-10-CM | POA: Insufficient documentation

## 2021-05-19 DIAGNOSIS — Z87891 Personal history of nicotine dependence: Secondary | ICD-10-CM | POA: Diagnosis not present

## 2021-05-19 DIAGNOSIS — O0091 Unspecified ectopic pregnancy with intrauterine pregnancy: Secondary | ICD-10-CM | POA: Insufficient documentation

## 2021-05-19 DIAGNOSIS — R079 Chest pain, unspecified: Secondary | ICD-10-CM

## 2021-05-19 DIAGNOSIS — N809 Endometriosis, unspecified: Secondary | ICD-10-CM | POA: Diagnosis not present

## 2021-05-19 LAB — URINALYSIS, COMPLETE (UACMP) WITH MICROSCOPIC
Bilirubin Urine: NEGATIVE
Glucose, UA: NEGATIVE mg/dL
Hgb urine dipstick: NEGATIVE
Ketones, ur: NEGATIVE mg/dL
Leukocytes,Ua: NEGATIVE
Nitrite: POSITIVE — AB
Protein, ur: NEGATIVE mg/dL
Specific Gravity, Urine: 1.043 — ABNORMAL HIGH (ref 1.005–1.030)
pH: 6 (ref 5.0–8.0)

## 2021-05-19 LAB — PROTIME-INR
INR: 1 (ref 0.8–1.2)
Prothrombin Time: 12.8 seconds (ref 11.4–15.2)

## 2021-05-19 LAB — URINE DRUG SCREEN, QUALITATIVE (ARMC ONLY)
Amphetamines, Ur Screen: NOT DETECTED
Barbiturates, Ur Screen: NOT DETECTED
Benzodiazepine, Ur Scrn: NOT DETECTED
Cannabinoid 50 Ng, Ur ~~LOC~~: POSITIVE — AB
Cocaine Metabolite,Ur ~~LOC~~: NOT DETECTED
MDMA (Ecstasy)Ur Screen: NOT DETECTED
Methadone Scn, Ur: NOT DETECTED
Opiate, Ur Screen: NOT DETECTED
Phencyclidine (PCP) Ur S: NOT DETECTED
Tricyclic, Ur Screen: NOT DETECTED

## 2021-05-19 LAB — CBC
HCT: 40.9 % (ref 36.0–46.0)
Hemoglobin: 14.1 g/dL (ref 12.0–15.0)
MCH: 32.9 pg (ref 26.0–34.0)
MCHC: 34.5 g/dL (ref 30.0–36.0)
MCV: 95.6 fL (ref 80.0–100.0)
Platelets: 274 10*3/uL (ref 150–400)
RBC: 4.28 MIL/uL (ref 3.87–5.11)
RDW: 13.7 % (ref 11.5–15.5)
WBC: 9.3 10*3/uL (ref 4.0–10.5)
nRBC: 0 % (ref 0.0–0.2)

## 2021-05-19 LAB — DIFFERENTIAL
Abs Immature Granulocytes: 0.02 10*3/uL (ref 0.00–0.07)
Basophils Absolute: 0 10*3/uL (ref 0.0–0.1)
Basophils Relative: 0 %
Eosinophils Absolute: 0.3 10*3/uL (ref 0.0–0.5)
Eosinophils Relative: 3 %
Immature Granulocytes: 0 %
Lymphocytes Relative: 21 %
Lymphs Abs: 2 10*3/uL (ref 0.7–4.0)
Monocytes Absolute: 0.5 10*3/uL (ref 0.1–1.0)
Monocytes Relative: 6 %
Neutro Abs: 6.5 10*3/uL (ref 1.7–7.7)
Neutrophils Relative %: 70 %

## 2021-05-19 LAB — COMPREHENSIVE METABOLIC PANEL
ALT: 18 U/L (ref 0–44)
AST: 19 U/L (ref 15–41)
Albumin: 4 g/dL (ref 3.5–5.0)
Alkaline Phosphatase: 41 U/L (ref 38–126)
Anion gap: 6 (ref 5–15)
BUN: 17 mg/dL (ref 6–20)
CO2: 28 mmol/L (ref 22–32)
Calcium: 9.2 mg/dL (ref 8.9–10.3)
Chloride: 105 mmol/L (ref 98–111)
Creatinine, Ser: 0.87 mg/dL (ref 0.44–1.00)
GFR, Estimated: >60 mL/min (ref >60)
Glucose, Bld: 91 mg/dL (ref 70–99)
Potassium: 4 mmol/L (ref 3.5–5.1)
Sodium: 139 mmol/L (ref 135–145)
Total Bilirubin: 0.5 mg/dL (ref 0.3–1.2)
Total Protein: 7.3 g/dL (ref 6.5–8.1)

## 2021-05-19 LAB — ETHANOL: Alcohol, Ethyl (B): <10 mg/dL (ref <10)

## 2021-05-19 LAB — TROPONIN I (HIGH SENSITIVITY): Troponin I (High Sensitivity): 2 ng/L (ref <18)

## 2021-05-19 LAB — LIPASE, BLOOD: Lipase: 31 U/L (ref 11–51)

## 2021-05-19 LAB — HCG, QUANTITATIVE, PREGNANCY: hCG, Beta Chain, Quant, S: <1 m[IU]/mL (ref <5)

## 2021-05-19 LAB — APTT: aPTT: 32 seconds (ref 24–36)

## 2021-05-19 LAB — RESP PANEL BY RT-PCR (FLU A&B, COVID) ARPGX2
Influenza A by PCR: NEGATIVE
Influenza B by PCR: NEGATIVE
SARS Coronavirus 2 by RT PCR: NEGATIVE

## 2021-05-19 LAB — D-DIMER, QUANTITATIVE: D-Dimer, Quant: 0.48 ug/mL-FEU (ref 0.00–0.50)

## 2021-05-19 LAB — T4, FREE: Free T4: 0.82 ng/dL (ref 0.61–1.12)

## 2021-05-19 LAB — TSH: TSH: 2.538 u[IU]/mL (ref 0.350–4.500)

## 2021-05-19 MED ORDER — CEPHALEXIN 500 MG PO CAPS: 500.0000 mg | ORAL_CAPSULE | Freq: Three times a day (TID) | ORAL | 0 refills | Status: AC

## 2021-05-19 MED ORDER — IOHEXOL 350 MG/ML SOLN
100.0000 mL | Freq: Once | INTRAVENOUS | Status: AC | PRN
  Administered 2021-05-19: 100 mL via INTRAVENOUS

## 2021-05-19 MED ORDER — GADOBUTROL 1 MMOL/ML IV SOLN
7.0000 mL | Freq: Once | INTRAVENOUS | Status: AC | PRN
  Administered 2021-05-19: 7.5 mL via INTRAVENOUS

## 2021-05-19 MED ORDER — ONDANSETRON HCL 4 MG/2ML IJ SOLN
4.0000 mg | Freq: Once | INTRAMUSCULAR | Status: AC
  Administered 2021-05-19: 4 mg via INTRAVENOUS
  Filled 2021-05-19: qty 2

## 2021-05-19 MED ORDER — FENTANYL CITRATE PF 50 MCG/ML IJ SOSY
50.0000 ug | PREFILLED_SYRINGE | Freq: Once | INTRAMUSCULAR | Status: AC
  Administered 2021-05-19: 50 ug via INTRAVENOUS
  Filled 2021-05-19: qty 1

## 2021-05-19 MED ORDER — SODIUM CHLORIDE 0.9 % IV SOLN
1000.0000 mg | Freq: Once | INTRAVENOUS | Status: AC
  Administered 2021-05-19: 1000 mg via INTRAVENOUS
  Filled 2021-05-19: qty 8

## 2021-05-19 MED ORDER — OXYCODONE-ACETAMINOPHEN 5-325 MG PO TABS
1.0000 | ORAL_TABLET | Freq: Once | ORAL | Status: AC
  Administered 2021-05-19: 1 via ORAL
  Filled 2021-05-19: qty 1

## 2021-05-19 MED ORDER — OXYCODONE-ACETAMINOPHEN 5-325 MG PO TABS: 1.0000 | ORAL_TABLET | ORAL | 0 refills | Status: DC | PRN

## 2021-05-19 MED ORDER — CEPHALEXIN 500 MG PO CAPS
500.0000 mg | ORAL_CAPSULE | Freq: Once | ORAL | Status: AC
  Administered 2021-05-19: 500 mg via ORAL
  Filled 2021-05-19: qty 1

## 2021-05-19 NOTE — ED Triage Notes (Signed)
Pt with c/o back pain and MS flare. Pt states she has MS and her legs started tingling and left side started to feel numb. Pt states this started around 630 am.

## 2021-05-19 NOTE — ED Provider Notes (Signed)
[facility] Emergency Department Provider Note  ____________________________________________   Event Date/Time   First MD Initiated Contact with Patient 05/19/21 0827     (approximate)  I have reviewed the triage vital signs and the nursing notes.   HISTORY  Chief Complaint Back Pain    HPI Christie Oconnor is a 35 y.o. female with endometriosis, multiple sclerosis who comes in with multiple symptoms.  Patient reports having a history of stroke.  She states that she went to bed felt her normal self and woke up with some back pain and abdominal pain in the lower abdomen.  This was constant, nothing made it better or worse.  She states that that around 6 AM she developed left-sided arm and leg numbness feeling.  The leg numbness has since resolved and she continues to have left arm numbness and states that she feels a little weak on the left arm as well.  States that she has had only 1 MS flare before so she is not sure if this is just her MS but she does report a history of stroke although I do not see any evidence on her prior MRI on review of records.  She also reports some intermittent chest pain that is been going on for over a month with her most current episode starting yesterday.  She also reports some pleuritic chest pain as well.  No history of blood clots.            Past Medical History:  Diagnosis Date   Endometriosis    MS (multiple sclerosis) (HCC)    MS (multiple sclerosis) (HCC)     Patient Active Problem List   Diagnosis Date Noted   Neck pain 07/31/2020   High risk medication use 07/31/2020   Myalgia 07/31/2020   Multiple sclerosis (HCC) 12/22/2019   Gait disturbance 12/22/2019   Numbness 12/22/2019   Frequent headaches 12/22/2019   Urinary urgency 12/22/2019   CVA (cerebral vascular accident) (HCC) 07/15/2019    Past Surgical History:  Procedure Laterality Date   ANKLE SURGERY Left    CESAREAN SECTION     x 2   TUBAL  LIGATION      Prior to Admission medications   Medication Sig Start Date End Date Taking? Authorizing Provider  amitriptyline (ELAVIL) 25 MG tablet Take 25 mg by mouth at bedtime.    [provider]  cyclobenzaprine (FLEXERIL) 5 MG tablet Take 1 tablet (5 mg total) by mouth 3 (three) times daily as needed. 10/20/20   Menshew, Charlesetta Ivory, PA-C  gabapentin (NEURONTIN) 300 MG capsule One po qAM, one po qPM and two po qHS 07/31/20   Sater, Pearletha Furl, MD  ibuprofen (ADVIL) 800 MG tablet Take 1 tablet by mouth as needed.  10/28/15   [provider]  natalizumab (TYSABRI) 300 MG/15ML injection Inject into the vein.    [provider]  nortriptyline (PAMELOR) 10 MG capsule Take 1 capsule (10 mg total) by mouth 2 (two) times daily. 03/17/20   Sater, Pearletha Furl, MD  sertraline (ZOLOFT) 50 MG tablet Take 50 mg by mouth daily. 05/15/20   [provider]  Vitamin D, Ergocalciferol, (DRISDOL) 1.25 MG (50000 UNIT) CAPS capsule Take 1 capsule (50,000 Units total) by mouth every 7 (seven) days. 12/22/19   Sater, Pearletha Furl, MD    Allergies Patient has no known allergies.  Family History  Problem Relation Age of Onset   Multiple sclerosis Neg Hx  Social History Social History   Tobacco Use   Smoking status: Former   Smokeless tobacco: Never   Tobacco comments:    6 cigarettes per day  Vaping Use   Vaping Use: Never used  Substance Use Topics   Alcohol use: Not Currently    Comment: occ   Drug use: Not Currently      Review of Systems Constitutional: No fever/chills Eyes: No visual changes. ENT: No sore throat. Cardiovascular: Chest pain Respiratory: Shortness of breath Gastrointestinal: Abdominal pain Genitourinary: Negative for dysuria. Musculoskeletal: Back pain Skin: Negative for rash. Neurological: Negative for headaches, numbness and weakness on the left All other ROS negative ____________________________________________   PHYSICAL  EXAM:  VITAL SIGNS: ED Triage Vitals  Enc Vitals Group     BP 05/19/21 0822 131/85     Pulse Rate 05/19/21 0822 64     Resp 05/19/21 0832 18     Temp --      Temp src --      SpO2 05/19/21 0822 97 %     Weight 05/19/21 0830 160 lb (72.6 kg)     Height 05/19/21 0830 5\' 6"  (1.676 m)     Head Circumference --      Peak Flow --      Pain Score 05/19/21 0830 8     Pain Loc --      Pain Edu? --      Excl. in GC? --     Constitutional: Alert and oriented. Well appearing and in no acute distress. Eyes: Conjunctivae are normal. EOMI. Head: Atraumatic. Nose: No congestion/rhinnorhea. Mouth/Throat: Mucous membranes are moist.   Neck: No stridor. Trachea Midline. FROM Cardiovascular: Normal rate, regular rhythm. Grossly normal heart sounds.  Good peripheral circulation. Respiratory: Normal respiratory effort.  No retractions. Lungs CTAB. Gastrointestinal: Slightly tender in the lower abdomen.  No distention. No abdominal bruits.  Musculoskeletal: No lower extremity tenderness nor edema.  No joint effusions. Neurologic:  Normal speech and language.  Cranial nerves appear intact other than decreased grip strength of the left arm with some sensation changes of the left Skin:  Skin is warm, dry and intact. No rash noted. Psychiatric: Mood and affect are normal. Speech and behavior are normal. GU: Deferred   ____________________________________________   LABS (all labs ordered are listed, but only abnormal results are displayed)  Labs Reviewed  RESP PANEL BY RT-PCR (FLU A&B, COVID) ARPGX2  ETHANOL  PROTIME-INR  APTT  CBC  DIFFERENTIAL  COMPREHENSIVE METABOLIC PANEL  URINE DRUG SCREEN, QUALITATIVE ([facility] ONLY)  URINALYSIS, COMPLETE (UACMP) WITH MICROSCOPIC  D-DIMER, QUANTITATIVE  LIPASE, BLOOD  HCG, QUANTITATIVE, PREGNANCY  TSH  T4, FREE  TROPONIN I (HIGH SENSITIVITY)   ____________________________________________   ED ECG REPORT I, 05/21/21, the attending physician,  personally viewed and interpreted this ECG.  Normal sinus rate of 72, no ST elevation, no T wave versions, normal intervals ____________________________________________  RADIOLOGY Concha Se, personally viewed and evaluated these images (plain radiographs) as part of my medical decision making, as well as reviewing the written report by the radiologist.  ED MD interpretation: No pneumonia  Official radiology report(s): DG Chest Portable 1 View  Result Date: 05/19/2021 CLINICAL DATA:  Shortness of breath EXAM: PORTABLE CHEST 1 VIEW COMPARISON:  Chest radiograph dated 06/17/2012. FINDINGS: The heart size and mediastinal contours are within normal limits. Both lungs are clear. The visualized skeletal structures are unremarkable. IMPRESSION: No active disease. Electronically Signed   By: 06/19/2012.D.  On: 05/19/2021 09:25   CT HEAD CODE STROKE WO CONTRAST  Result Date: 05/19/2021 CLINICAL DATA:  Code stroke. 35 year old female with left side weakness and numbness. Last seen well 0630 hours. EXAM: CT HEAD WITHOUT CONTRAST TECHNIQUE: Contiguous axial images were obtained from the base of the skull through the vertex without intravenous contrast. COMPARISON:  Lumbar MRI 08/30/2020.  Head CT 05/31/2020. FINDINGS: Brain: Stable cerebral volume. No midline shift, ventriculomegaly, mass effect, evidence of mass lesion, intracranial hemorrhage or evidence of cortically based acute infarction. Gray-white matter differentiation is within normal limits throughout the brain. Vascular: No suspicious intracranial vascular hyperdensity. Skull: Negative. Sinuses/Orbits: Visualized paranasal sinuses and mastoids are stable and well aerated. Other: Visualized orbit soft tissues are within normal limits. Visualized orbits and scalp soft tissues are within normal limits. ASPECTS [facility] Stroke Program Early CT Score) Total score (0-10 with 10 being normal): 10 IMPRESSION: Stable and normal noncontrast CT  appearance of the brain. ASPECTS 10. These results were communicated to Dr. Wilford Corner at 8:55 am on 05/19/2021 by text page via the [facility] messaging system. Electronically Signed   By: Odessa Fleming M.D.   On: 05/19/2021 08:56    ____________________________________________   PROCEDURES  Procedure(s) performed (including Critical Care):  .1-3 Lead EKG Interpretation  Date/Time: 05/19/2021 8:45 AM Performed by: Concha Se, MD Authorized by: Concha Se, MD     Interpretation: normal     ECG rate:  60s   ECG rate assessment: normal     Rhythm: sinus rhythm     Ectopy: none     Conduction: normal     ____________________________________________   INITIAL IMPRESSION / ASSESSMENT AND PLAN / ED COURSE  ADREANNA MANS was evaluated in Emergency Department on 05/19/2021 for the symptoms described in the history of present illness. She was evaluated in the context of the global COVID-19 pandemic, which necessitated consideration that the patient might be at risk for infection with the SARS-CoV-2 virus that causes COVID-19. Institutional protocols and algorithms that pertain to the evaluation of patients at risk for COVID-19 are in a state of rapid change based on information released by regulatory bodies including the CDC and federal and state organizations. These policies and algorithms were followed during the patient's care in the ED.    Patient comes in with multiple symptoms and does have a history of MS but most concerning is that she stated that she is having left arm numbness and weakness.  Patient reports a history of stroke therefore stroke code was called in order to be further evaluated by neurology.  I also will get labs to evaluate for ectopic pregnancy, UTI, PE, ACS.  We will keep patient the cardiac monitor to evaluate for any arrhythmia  Discussed with neurologist Dr. Wilford Corner who recommends MRI brain and neck with and without contrast.  We will need to do work-up however to make  sure no evidence of any other abdominal or cardiac pathology.  Given patient continues to have chest pain and abdominal pain with left-sided tingling we will also get CT dissection to make sure no evidence of dissection.  Reevaluation patient is feeling better after the pain medication.  Discussed with neurology who reviewed the MRIs and there is no signs of any active flares.  He recommends if the urine is negative given a gram of Solu-Medrol he is okay with her being discharged to follow-up with neurology outpatient.  Patient was handed off to oncoming team pending UA.  We will give 1  dose of Solu-Medrol 1 g IV as long as UA is negative and patient will follow-up with neurology    ____________________________________________   FINAL CLINICAL IMPRESSION(S) / ED DIAGNOSES   Final diagnoses:  Tingling  Chest pain, unspecified type  Multiple sclerosis (HCC)      MEDICATIONS GIVEN DURING THIS VISIT:  Medications  fentaNYL (SUBLIMAZE) injection 50 mcg (50 mcg Intravenous Given 05/19/21 1033)  ondansetron (ZOFRAN) injection 4 mg (4 mg Intravenous Given 05/19/21 1033)  iohexol (OMNIPAQUE) 350 MG/ML injection 100 mL (100 mLs Intravenous Contrast Given 05/19/21 1121)  gadobutrol (GADAVIST) 1 MMOL/ML injection 7 mL (7.5 mLs Intravenous Contrast Given 05/19/21 1230)     ED Discharge Orders     None        Note:  This document was prepared using Dragon voice recognition software and may include unintentional dictation errors.    Concha Se, MD 05/19/21 [phone]

## 2021-05-19 NOTE — ED Notes (Signed)
ED Provider at bedside. 

## 2021-05-19 NOTE — Progress Notes (Signed)
  Chaplain On-Call responded to Code Stroke notification.  Chaplain received medical update from Dr. Wilford Corner who stated that he suspects the patient's symptoms are related to her history of MS. The patient will receive an MRI exam for further evaluation.  No family is present with the patient, who is being attended by the medical team.  Lunette Stands is available for additional support as needed.  Chaplain Evelena Peat M.Div., [facility]

## 2021-05-19 NOTE — Consult Note (Addendum)
Neurology Consultation  Reason for Consult: Left-sided numbness and tingling-code stroke Referring Physician: Dr. Artis Delay  CC: Left-sided numbness  History is obtained from: Patient, chart  HPI: Christie Oconnor is a 35 y.o. female past medical history of multiple sclerosis, last NMO antibody test negative with lesions involving the brain and spinal cord, on Tysabri not on treatment for the past year or so, patient initially of Dr. Epimenio Foot and eventually followed by Dr. Malvin Johns at [place] [facility], comes in for evaluation of left-sided tingling and numbness that she noted at around 630 this morning.  She reports that she went to bed 1130 last night in her usual state of health and woke up this morning around 530 or 5:45 with a spell of cough and nausea and also had an episode of vomiting.  This was followed by her realizing that she has numbness and tingling on her left arm and leg.  Her last known well to the best of my history taking is 11:30 PM last night. She denies any recent illnesses or sicknesses.  Denies any recent infections.  Denies any urinary urgency or frequency.  Denies any exposure to anyone with a recent COVID infection or herself being COVID-positive.  Also complains of pain in her neck back and limbs which is worse than her usual baseline pain that she has had now for few years. Reports that she was on Tysabri up until last year but had to discontinue because of some personal reasons-had a family member who was in a car crash and required assistance and she was busy helping the family member.   LKW: 11:30 PM last night tpa given?: no, likely MS exacerbation and not a stroke Premorbid modified Rankin scale (mRS): 1  ROS: Full ROS was performed and is negative except as noted in the HPI.    Past Medical History:  Diagnosis Date   Endometriosis    MS (multiple sclerosis) (HCC)    MS (multiple sclerosis) (HCC)    Family History  Problem Relation Age of Onset   Multiple  sclerosis Neg Hx    Social History:   reports that she has quit smoking. She has never used smokeless tobacco. She reports that she does not currently use alcohol. She reports that she does not currently use drugs.  Medications No current facility-administered medications for this encounter.  Current Outpatient Medications:    amitriptyline (ELAVIL) 25 MG tablet, Take 25 mg by mouth at bedtime., Disp: , Rfl:    cyclobenzaprine (FLEXERIL) 5 MG tablet, Take 1 tablet (5 mg total) by mouth 3 (three) times daily as needed., Disp: 15 tablet, Rfl: 0   gabapentin (NEURONTIN) 300 MG capsule, One po qAM, one po qPM and two po qHS, Disp: 120 capsule, Rfl: 11   ibuprofen (ADVIL) 800 MG tablet, Take 1 tablet by mouth as needed. , Disp: , Rfl:    natalizumab (TYSABRI) 300 MG/15ML injection, Inject into the vein., Disp: , Rfl:    nortriptyline (PAMELOR) 10 MG capsule, Take 1 capsule (10 mg total) by mouth 2 (two) times daily., Disp: 60 capsule, Rfl: 11   sertraline (ZOLOFT) 50 MG tablet, Take 50 mg by mouth daily., Disp: , Rfl:    Vitamin D, Ergocalciferol, (DRISDOL) 1.25 MG (50000 UNIT) CAPS capsule, Take 1 capsule (50,000 Units total) by mouth every 7 (seven) days., Disp: 13 capsule, Rfl: 4   Exam: Current vital signs: BP 131/85 (BP Location: Left Arm)   Pulse 64   Resp 18   Ht  5\' 6"  (1.676 m)   Wt 72.6 kg   LMP  (LMP Unknown)   SpO2 97%   BMI 25.82 kg/m  Vital signs in last 24 hours: Pulse Rate:  [64] 64 (08/27 0822) Resp:  [18] 18 (08/27 0832) BP: (131)/(85) 131/85 (08/27 0822) SpO2:  [97 %] 97 % (08/27 0822) Weight:  [72.6 kg] 72.6 kg (08/27 0830)  General: Awake alert no distress HEENT: Normocephalic, atraumatic Lungs: Clear Cardiovascular: Regular rhythm Abdomen with mild general tenderness Extremities warm well perfused Neurological exam Awake alert oriented x3 Speech is not dysarthric There is no aphasia Cranial nerves: Pupils equal round react light, extract movements  intact, visual fields full, face appears symmetric, tongue and palate midline Motor examination: Both upper extremities are full strength 5/5. Both lower extremities and mild weakness with the weakness 4/5 on the left hip and 4+/5 on the right hip and distally 4+/5 bilaterally. Sensation: Intact on the legs but notices reduced sensation to light touch on left arm. Coordination: No dysmetria Gait testing deferred In a stroke scale 1a Level of Conscious.: 0 1b LOC Questions: 0 1c LOC Commands:  2 Best Gaze: 0 3 Visual: 0 4 Facial Palsy: 0 5a Motor Arm - left: 0 5b Motor Arm - Right: 0 6a Motor Leg - Left: 1 6b Motor Leg - Right: 0 7 Limb Ataxia: 0 8 Sensory: 1 9 Best Language: 0 10 Dysarthria: 0 11 Extinct. and Inatten.: 0 TOTAL: 2  Labs I have reviewed labs in epic and the results pertinent to this consultation are:   CBC    Component Value Date/Time   WBC 8.8 07/31/2020 1421   WBC 5.7 05/31/2020 1123   RBC 4.11 07/31/2020 1421   RBC 3.82 (L) 05/31/2020 1123   HGB 13.2 07/31/2020 1421   HCT 38.6 07/31/2020 1421   PLT 266 07/31/2020 1421   MCV 94 07/31/2020 1421   MCV 97 10/10/2014 1716   MCH 32.1 07/31/2020 1421   MCH 32.2 05/31/2020 1123   MCHC 34.2 07/31/2020 1421   MCHC 33.7 05/31/2020 1123   RDW 13.7 07/31/2020 1421   RDW 13.6 10/10/2014 1716   LYMPHSABS 3.4 (H) 07/31/2020 1421   LYMPHSABS 1.4 10/10/2014 1716   MONOABS 0.9 05/31/2020 1123   MONOABS 1.1 (H) 10/10/2014 1716   EOSABS 0.4 07/31/2020 1421   EOSABS 0.0 10/10/2014 1716   BASOSABS 0.1 07/31/2020 1421   BASOSABS 0.1 10/10/2014 1716    CMP     Component Value Date/Time   NA 138 05/31/2020 1123   NA 137 06/23/2014 1838   K 3.6 05/31/2020 1123   K 3.7 06/23/2014 1838   CL 104 05/31/2020 1123   CL 103 06/23/2014 1838   CO2 25 05/31/2020 1123   CO2 28 06/23/2014 1838   GLUCOSE 96 05/31/2020 1123   GLUCOSE 59 (L) 06/23/2014 1838   BUN 15 05/31/2020 1123   BUN 12 06/23/2014 1838   CREATININE  0.88 05/31/2020 1123   CREATININE 0.74 06/23/2014 1838   CALCIUM 9.0 05/31/2020 1123   CALCIUM 8.5 06/23/2014 1838   PROT 7.1 07/15/2019 2127   PROT 7.0 07/22/2013 1107   ALBUMIN 3.8 07/15/2019 2127   ALBUMIN 3.5 07/22/2013 1107   AST 21 07/15/2019 2127   AST 21 07/22/2013 1107   ALT 15 07/15/2019 2127   ALT 17 07/22/2013 1107   ALKPHOS 38 07/15/2019 2127   ALKPHOS 51 07/22/2013 1107   BILITOT 0.7 07/15/2019 2127   BILITOT 0.5 07/22/2013 1107   GFRNONAA >  60 05/31/2020 1123   GFRNONAA >60 06/23/2014 1838   GFRNONAA >60 07/22/2013 1107   GFRAA >60 05/31/2020 1123   GFRAA >60 06/23/2014 1838   GFRAA >60 07/22/2013 1107    Imaging I have reviewed the images obtained: Noncontrasted head CT with aspects 10.  No acute changes.  No bleed  Assessment: 35 year old with history of multiple sclerosis off of Tysabri for over a year, presenting with sudden onset of left-sided tingling and numbness with mild worsening of left-sided lower extremity weakness and left-sided sensory deficits. Given her past history, I would favor this to be 1 for multiple sclerosis exacerbation than an acute stroke. Also, last known well is outside the window for IV tPA-last known well 11:30 PM last night.  Exam not suggestive of LVO.  I will get brain and C-spine MRI imaging and she will likely need steroids for the MS flareup-I will update dose and duration after the MRIs.  Impression: Likely MS exacerbation Nausea vomiting  Recommendations: MRI brain and c-spine w+w/o contrast-area postrenal involvement in the brainstem can lead to nausea and vomiting and not being tested to see if there is any involvement of that area on imaging today. Check UA, chest x-ray, abdominal films given her current complaints of nausea vomiting and coughing fits. Will do course of steroids if there is enhancement Pain management per ER. Outpatient follow-up with Dr. Malvin Johns for continued Tysabri treatment  Plan discussed with  Dr. Fuller Plan in the emergency room Plan also discussed with the patient and the bedside RN.    Addendum after imaging: MRI is completed-reviewed personally. MRI brain-with and without contrast-no abnormal enhancement, unchanged cerebral white matter disease consistent with MS. MRI C-spine with and without contrast-unchanged C-spine lesion consistent with MS.  No new or enhancing lesions.  Mild progression of C4-5 spondylosis with moderate spinal stenosis.  Updated recommendations: Check UA-as if negative, do not think she needs multiple days of steroids.  Due to some difficulty walking, will offer Solu-Medrol 1 g IV x1. Outpatient follow-up in the next 2 weeks with outpatient neurologist for resumption of disease modifying treatment. Discussed my plan with Dr. Alfred Levins  ADDENDUM UA nitrite +ve with minimal WBC. Send C/S, treat UTI and give one dose of solumedrol 1 g IV for possible MS Pseudoexacerbation in the setting of UTI  -- Milon Dikes, MD Neurologist [facility] Pager: [phone]  -- Milon Dikes, MD Neurologist [facility] Pager: [phone]

## 2021-05-19 NOTE — ED Notes (Signed)
Pt given water 

## 2021-05-19 NOTE — ED Notes (Signed)
Patient transported to MRI 

## 2021-05-19 NOTE — ED Notes (Signed)
Pt taken to room to see EDP, EDP in room speaking with pt.

## 2021-05-19 NOTE — ED Notes (Signed)
Pt given warm blanket.

## 2021-05-19 NOTE — ED Provider Notes (Signed)
-----------------------------------------   4:03 PM on 05/19/2021 ----------------------------------------- Urinalysis has resulted showing nitrites but otherwise appears largely within normal limits.  We will send urine culture.  I spoke to Dr. Jerrell Belfast who does still recommend doing 1000 mg of IV Solu-Medrol.  We will discharge on a 10-day course of Keflex to cover for any possible urinary tract infection.  Urine culture has been sent.  We will discharge with short course of pain medication.  Patient agreeable to plan of care.   Minna Antis, MD 05/19/21 [phone]

## 2021-05-19 NOTE — ED Notes (Signed)
Family at bedside. 

## 2021-05-19 NOTE — Discharge Instructions (Signed)
Neurology evaluated you and gave you 1 g of Solu-Medrol but you need to call the neurology office to get a follow-up appointment.  Your other labs were reassuring.  Return to the ER for worsening symptoms or any other concern

## 2021-05-22 LAB — URINE CULTURE: Culture: >=100000 — AB

## 2021-10-23 ENCOUNTER — Encounter (HOSPITAL_COMMUNITY): Payer: Self-pay | Admitting: Emergency Medicine

## 2021-10-23 ENCOUNTER — Other Ambulatory Visit: Payer: Self-pay

## 2021-10-23 ENCOUNTER — Emergency Department (HOSPITAL_COMMUNITY)
Admission: EM | Admit: 2021-10-23 | Discharge: 2021-10-24 | Disposition: A | Discharge status: Home / Self Care | Payer: Medicaid Other | Attending: Emergency Medicine | Admitting: Emergency Medicine

## 2021-10-23 ENCOUNTER — Emergency Department (HOSPITAL_COMMUNITY): Payer: Medicaid Other

## 2021-10-23 DIAGNOSIS — N390 Urinary tract infection, site not specified: Secondary | ICD-10-CM | POA: Diagnosis not present

## 2021-10-23 DIAGNOSIS — R519 Headache, unspecified: Secondary | ICD-10-CM | POA: Diagnosis not present

## 2021-10-23 DIAGNOSIS — Z79899 Other long term (current) drug therapy: Secondary | ICD-10-CM | POA: Insufficient documentation

## 2021-10-23 DIAGNOSIS — R1013 Epigastric pain: Secondary | ICD-10-CM | POA: Insufficient documentation

## 2021-10-23 DIAGNOSIS — R0602 Shortness of breath: Secondary | ICD-10-CM | POA: Insufficient documentation

## 2021-10-23 DIAGNOSIS — R197 Diarrhea, unspecified: Secondary | ICD-10-CM | POA: Diagnosis not present

## 2021-10-23 DIAGNOSIS — R112 Nausea with vomiting, unspecified: Secondary | ICD-10-CM | POA: Insufficient documentation

## 2021-10-23 LAB — CBC WITH DIFFERENTIAL/PLATELET
Abs Immature Granulocytes: 0.02 10*3/uL (ref 0.00–0.07)
Basophils Absolute: 0 10*3/uL (ref 0.0–0.1)
Basophils Relative: 0 %
Eosinophils Absolute: 0.3 10*3/uL (ref 0.0–0.5)
Eosinophils Relative: 4 %
HCT: 39.4 % (ref 36.0–46.0)
Hemoglobin: 13.6 g/dL (ref 12.0–15.0)
Immature Granulocytes: 0 %
Lymphocytes Relative: 28 %
Lymphs Abs: 2.5 10*3/uL (ref 0.7–4.0)
MCH: 33.1 pg (ref 26.0–34.0)
MCHC: 34.5 g/dL (ref 30.0–36.0)
MCV: 95.9 fL (ref 80.0–100.0)
Monocytes Absolute: 0.6 10*3/uL (ref 0.1–1.0)
Monocytes Relative: 7 %
Neutro Abs: 5.5 10*3/uL (ref 1.7–7.7)
Neutrophils Relative %: 61 %
Platelets: 266 10*3/uL (ref 150–400)
RBC: 4.11 MIL/uL (ref 3.87–5.11)
RDW: 13.8 % (ref 11.5–15.5)
WBC: 9 10*3/uL (ref 4.0–10.5)
nRBC: 0 % (ref 0.0–0.2)

## 2021-10-23 LAB — COMPREHENSIVE METABOLIC PANEL
ALT: 17 U/L (ref 0–44)
AST: 19 U/L (ref 15–41)
Albumin: 4 g/dL (ref 3.5–5.0)
Alkaline Phosphatase: 34 U/L — ABNORMAL LOW (ref 38–126)
Anion gap: 7 (ref 5–15)
BUN: 14 mg/dL (ref 6–20)
CO2: 22 mmol/L (ref 22–32)
Calcium: 8.7 mg/dL — ABNORMAL LOW (ref 8.9–10.3)
Chloride: 107 mmol/L (ref 98–111)
Creatinine, Ser: 0.73 mg/dL (ref 0.44–1.00)
GFR, Estimated: >60 mL/min (ref >60)
Glucose, Bld: 89 mg/dL (ref 70–99)
Potassium: 3.1 mmol/L — ABNORMAL LOW (ref 3.5–5.1)
Sodium: 136 mmol/L (ref 135–145)
Total Bilirubin: 0.4 mg/dL (ref 0.3–1.2)
Total Protein: 7.1 g/dL (ref 6.5–8.1)

## 2021-10-23 LAB — I-STAT BETA HCG BLOOD, ED (MC, WL, AP ONLY): I-stat hCG, quantitative: <5 m[IU]/mL (ref <5)

## 2021-10-23 LAB — LIPASE, BLOOD: Lipase: 66 U/L — ABNORMAL HIGH (ref 11–51)

## 2021-10-23 MED ORDER — KETOROLAC TROMETHAMINE 30 MG/ML IJ SOLN
15.0000 mg | Freq: Once | INTRAMUSCULAR | Status: AC
  Administered 2021-10-24: 15 mg via INTRAVENOUS
  Filled 2021-10-23: qty 1

## 2021-10-23 MED ORDER — METOCLOPRAMIDE HCL 5 MG/ML IJ SOLN
10.0000 mg | Freq: Once | INTRAMUSCULAR | Status: AC
  Administered 2021-10-24: 10 mg via INTRAVENOUS
  Filled 2021-10-23: qty 2

## 2021-10-23 MED ORDER — DIPHENHYDRAMINE HCL 50 MG/ML IJ SOLN
25.0000 mg | Freq: Once | INTRAMUSCULAR | Status: AC
  Administered 2021-10-24: 25 mg via INTRAVENOUS
  Filled 2021-10-23: qty 1

## 2021-10-23 MED ORDER — SODIUM CHLORIDE 0.9 % IV BOLUS
1000.0000 mL | Freq: Once | INTRAVENOUS | Status: AC
  Administered 2021-10-24: 1000 mL via INTRAVENOUS

## 2021-10-23 NOTE — ED Provider Notes (Signed)
[facility] EMERGENCY DEPARTMENT Provider Note   CSN: [id number] Arrival date & time: 10/23/21  2256     History  Chief Complaint  Patient presents with   Headache    Christie EhlersJennifer R Oconnor is a [age] y.o. female.  Patient presents to the emergency department for evaluation of headache, nausea, vomiting and diarrhea.  Patient reports that she has felt short of breath, mostly when she is vomiting.  Patient has not had any specific cough or other cold symptoms.  No sick contacts.  No fever.  Patient denies specific abdominal pain, has had cramping and mostly nausea.      Home Medications Prior to Admission medications   Medication Sig Start Date End Date Taking? Authorizing Provider  cephALEXin (KEFLEX) 500 MG capsule Take 1 capsule (500 mg total) by mouth 2 (two) times daily. 10/24/21  Yes Meggan Dhaliwal, Canary Brimhristopher J, MD  ondansetron (ZOFRAN) 4 MG tablet Take 1 tablet (4 mg total) by mouth every 6 (six) hours. 10/24/21  Yes Doralyn Kirkes, Canary Brimhristopher J, MD  amitriptyline (ELAVIL) 25 MG tablet Take 25 mg by mouth at bedtime. Patient not taking: Reported on 05/19/2021    [provider]  cyclobenzaprine (FLEXERIL) 5 MG tablet Take 1 tablet (5 mg total) by mouth 3 (three) times daily as needed. 10/20/20   Menshew, Charlesetta IvoryJenise V Bacon, PA-C  gabapentin (NEURONTIN) 300 MG capsule One po qAM, one po qPM and two po qHS 07/31/20   Sater, Pearletha Furlichard A, MD  ibuprofen (ADVIL) 800 MG tablet Take 1 tablet by mouth as needed.  Patient not taking: Reported on 05/19/2021 10/28/15   [provider]  imipramine (TOFRANIL) 25 MG tablet Take 25 mg by mouth at bedtime.    [provider]  natalizumab (TYSABRI) 300 MG/15ML injection Inject into the vein. Patient not taking: Reported on 05/19/2021    [provider]  nortriptyline (PAMELOR) 10 MG capsule Take 1 capsule (10 mg total) by mouth 2 (two) times daily. 03/17/20   Sater, Pearletha Furlichard A, MD  oxyCODONE-acetaminophen (PERCOCET) 5-325 MG tablet Take 1  tablet by mouth every 4 (four) hours as needed for severe pain. 05/19/21   Minna AntisPaduchowski, Kevin, MD  sertraline (ZOLOFT) 50 MG tablet Take 50 mg by mouth daily. 05/15/20   [provider]  Vitamin D, Ergocalciferol, (DRISDOL) 1.25 MG (50000 UNIT) CAPS capsule Take 1 capsule (50,000 Units total) by mouth every 7 (seven) days. Patient not taking: Reported on 05/19/2021 12/22/19   Asa LenteSater, Richard A, MD      Allergies    Patient has no known allergies.    Review of Systems   Review of Systems  Respiratory:  Positive for shortness of breath.   Gastrointestinal:  Positive for diarrhea, nausea and vomiting.   Physical Exam Updated Vital Signs BP 103/74    Pulse 95    Temp 98 F (36.7 C) (Oral)    Resp 17    Ht 5\' 7"  (1.702 m)    Wt 73.5 kg    LMP 09/02/2021    SpO2 99%    BMI 25.37 kg/m  Physical Exam Vitals and nursing note reviewed.  Constitutional:      General: She is not in acute distress.    Appearance: She is well-developed.  HENT:     Head: Normocephalic and atraumatic.  Eyes:     Conjunctiva/sclera: Conjunctivae normal.  Cardiovascular:     Rate and Rhythm: Normal rate and regular rhythm.     Heart sounds: No murmur heard. Pulmonary:  Effort: Pulmonary effort is normal. No respiratory distress.     Breath sounds: Normal breath sounds.  Abdominal:     Palpations: Abdomen is soft.     Tenderness: There is abdominal tenderness in the epigastric area.  Musculoskeletal:        General: No swelling.     Cervical back: Neck supple.  Skin:    General: Skin is warm and dry.     Capillary Refill: Capillary refill takes less than 2 seconds.  Neurological:     Mental Status: She is alert.  Psychiatric:        Mood and Affect: Mood normal.    ED Results / Procedures / Treatments   Labs (all labs ordered are listed, but only abnormal results are displayed) Labs Reviewed  COMPREHENSIVE METABOLIC PANEL - Abnormal; Notable for the following components:      Result Value    Potassium 3.1 (*)    Calcium 8.7 (*)    Alkaline Phosphatase 34 (*)    All other components within normal limits  LIPASE, BLOOD - Abnormal; Notable for the following components:   Lipase 66 (*)    All other components within normal limits  URINALYSIS, ROUTINE W REFLEX MICROSCOPIC - Abnormal; Notable for the following components:   Nitrite POSITIVE (*)    All other components within normal limits  URINALYSIS, MICROSCOPIC (REFLEX) - Abnormal; Notable for the following components:   Bacteria, UA MANY (*)    All other components within normal limits  URINE CULTURE  CBC WITH DIFFERENTIAL/PLATELET  I-STAT BETA HCG BLOOD, ED ([facility], [facility], [facility] ONLY)    EKG None  Radiology CT ABDOMEN PELVIS W CONTRAST  Result Date: 10/24/2021 CLINICAL DATA:  Abdominal pain and vomiting for several days, initial encounter EXAM: CT ABDOMEN AND PELVIS WITH CONTRAST TECHNIQUE: Multidetector CT imaging of the abdomen and pelvis was performed using the standard protocol following bolus administration of intravenous contrast. RADIATION DOSE REDUCTION: This exam was performed according to the departmental dose-optimization program which includes automated exposure control, adjustment of the mA and/or kV according to patient size and/or use of iterative reconstruction technique. CONTRAST:  143mL OMNIPAQUE IOHEXOL 300 MG/ML  SOLN COMPARISON:  05/19/2021 FINDINGS: Lower chest: No acute abnormality. Hepatobiliary: No focal liver abnormality is seen. No gallstones, gallbladder wall thickening, or biliary dilatation. Pancreas: Unremarkable. No pancreatic ductal dilatation or surrounding inflammatory changes. Spleen: Normal in size without focal abnormality. Adrenals/Urinary Tract: Adrenal glands are within normal limits. Kidneys demonstrate a normal enhancement pattern bilaterally. No renal calculi or obstructive changes are noted. The bladder is partially distended. Stomach/Bowel: The appendix is well visualized within normal limits.  Colon demonstrates some fluid within the ascending and transverse colons without obstructive change. Stomach and small bowel appear within normal limits. Vascular/Lymphatic: No significant vascular findings are present. No enlarged abdominal or pelvic lymph nodes. Reproductive: Uterus and bilateral adnexa are unremarkable. Other: Minimal free fluid is noted within the pelvis likely physiologic in nature. Musculoskeletal: No acute or significant osseous findings. IMPRESSION: No acute abnormality noted. Electronically Signed   By: Inez Catalina M.D.   On: 10/24/2021 03:00   DG Chest Port 1 View  Result Date: 10/24/2021 CLINICAL DATA:  Shortness of breath EXAM: PORTABLE CHEST 1 VIEW COMPARISON:  05/19/2021 FINDINGS: Lungs are clear.  No pleural effusion or pneumothorax. The heart is normal in size. IMPRESSION: No evidence of acute cardiopulmonary disease. Electronically Signed   By: Julian Hy M.D.   On: 10/24/2021 00:42    Procedures Procedures  Medications Ordered in ED Medications  cefTRIAXone (ROCEPHIN) 1 g in sodium chloride 0.9 % 100 mL IVPB (has no administration in time range)  sodium chloride 0.9 % bolus 1,000 mL (0 mLs Intravenous Stopped 10/24/21 0208)  ketorolac (TORADOL) 30 MG/ML injection 15 mg (15 mg Intravenous Given 10/24/21 0004)  metoCLOPramide (REGLAN) injection 10 mg (10 mg Intravenous Given 10/24/21 0007)  diphenhydrAMINE (BENADRYL) injection 25 mg (25 mg Intravenous Given 10/24/21 0006)  ondansetron (ZOFRAN) injection 4 mg (4 mg Intravenous Given 10/24/21 0204)  iohexol (OMNIPAQUE) 300 MG/ML solution 100 mL (100 mLs Intravenous Contrast Given 10/24/21 0241)    ED Course/ Medical Decision Making/ A&P                           Medical Decision Making Amount and/or Complexity of Data Reviewed Labs: ordered. Radiology: ordered.  Risk Prescription drug management.  Patient presents to the emergency department for evaluation of nausea and vomiting.  She has had some  intermittent diarrhea as well but it is predominantly upper abdominal pain with nausea and vomiting.  Exam reveals mild to moderate tenderness in the epigastric area, no lower abdominal or pelvic tenderness.  Differential diagnosis includes gastroenteritis, colitis, diverticulitis, GERD, gastritis, small bowel obstruction, cholecystitis, appendicitis.  Blood work was reassuring.  Patient underwent CT scan for further evaluation.  No acute pathology is noted.  There was delay in her giving a urine sample but when it was obtained there is suspicion for infection with positive nitrites.  Similar urinalysis in the past did grow E. coli sensitive to cefazolin.  Treated with Rocephin, discharged with Keflex, antiemetics.        Final Clinical Impression(s) / ED Diagnoses Final diagnoses:  Urinary tract infection without hematuria, site unspecified  Nausea and vomiting, unspecified vomiting type    Rx / DC Orders ED Discharge Orders          Ordered    cephALEXin (KEFLEX) 500 MG capsule  2 times daily        10/24/21 0508    ondansetron (ZOFRAN) 4 MG tablet  Every 6 hours        10/24/21 0508              Orpah Greek, MD 10/24/21 [phone]

## 2021-10-23 NOTE — ED Triage Notes (Signed)
Pt c/o emesis, headache, and sob since Thursday.

## 2021-10-23 NOTE — ED Notes (Signed)
In room to give pt her IV medication that was orderded; pt stated she wanted her IV removed that was just placed and put somewhere else because it was bothering her. Told pt that I would do so but that there was no guarantee that newly placed IV would be any better. Pt told this nurse that she wanted a different nurse to stick and care for her because "it seems like you have an attitude". Charge nurse notified. Care transferred over to Azzie Almas, RN.

## 2021-10-24 ENCOUNTER — Emergency Department (HOSPITAL_COMMUNITY): Payer: Medicaid Other

## 2021-10-24 LAB — URINALYSIS, ROUTINE W REFLEX MICROSCOPIC
Bilirubin Urine: NEGATIVE
Glucose, UA: NEGATIVE mg/dL
Hgb urine dipstick: NEGATIVE
Ketones, ur: NEGATIVE mg/dL
Leukocytes,Ua: NEGATIVE
Nitrite: POSITIVE — AB
Protein, ur: NEGATIVE mg/dL
Specific Gravity, Urine: 1.015 (ref 1.005–1.030)
pH: 5.5 (ref 5.0–8.0)

## 2021-10-24 LAB — URINALYSIS, MICROSCOPIC (REFLEX)
RBC / HPF: NONE SEEN RBC/hpf (ref 0–5)
Squamous Epithelial / HPF: NONE SEEN (ref 0–5)

## 2021-10-24 MED ORDER — ONDANSETRON HCL 4 MG/2ML IJ SOLN
4.0000 mg | Freq: Once | INTRAMUSCULAR | Status: AC
  Administered 2021-10-24: 4 mg via INTRAVENOUS
  Filled 2021-10-24: qty 2

## 2021-10-24 MED ORDER — ONDANSETRON HCL 4 MG PO TABS: 4.0000 mg | ORAL_TABLET | Freq: Four times a day (QID) | ORAL | 0 refills | Status: DC

## 2021-10-24 MED ORDER — CEPHALEXIN 500 MG PO CAPS: 500.0000 mg | ORAL_CAPSULE | Freq: Two times a day (BID) | ORAL | 0 refills | Status: DC

## 2021-10-24 MED ORDER — IOHEXOL 300 MG/ML  SOLN
100.0000 mL | Freq: Once | INTRAMUSCULAR | Status: AC | PRN
  Administered 2021-10-24: 100 mL via INTRAVENOUS

## 2021-10-24 MED ORDER — SODIUM CHLORIDE 0.9 % IV SOLN
1.0000 g | Freq: Once | INTRAVENOUS | Status: AC
  Administered 2021-10-24: 1 g via INTRAVENOUS
  Filled 2021-10-24: qty 10

## 2021-10-26 LAB — URINE CULTURE

## 2022-04-25 ENCOUNTER — Telehealth: Payer: Self-pay | Admitting: Neurology

## 2022-04-25 NOTE — Telephone Encounter (Signed)
Pt sent in a MyChart message requesting an appointment for a NCV/EMG. She has not been seen in our office since 2021 and has been following with Dr. Malvin Johns from [place]. Is this something that would need to be done through [facility] neuro, or can she be seen here? Please advise, see pt message below.  Comments: I need a nerve test done how do I setup an appointment for that?

## 2022-04-25 NOTE — Telephone Encounter (Signed)
Sent pt MyChart message back relaying below message from RN.

## 2022-06-25 ENCOUNTER — Ambulatory Visit: Payer: Medicaid Other | Attending: Neurology | Admitting: Physical Therapy

## 2022-06-25 DIAGNOSIS — G35 Multiple sclerosis: Secondary | ICD-10-CM | POA: Diagnosis present

## 2022-06-25 DIAGNOSIS — M542 Cervicalgia: Secondary | ICD-10-CM | POA: Diagnosis present

## 2022-06-25 DIAGNOSIS — R262 Difficulty in walking, not elsewhere classified: Secondary | ICD-10-CM | POA: Diagnosis present

## 2022-06-26 NOTE — Therapy (Signed)
[facility] [address] [place], [place], [address] Phone: [phone]   Fax:  [phone]  Physical Therapy Evaluation  Patient Details  Name: Christie Oconnor MRN: [id number] Date of Birth: 1986-06-09  Encounter Date: 06/25/2022   PT End of Session - 06/26/22 1836     Visit Number 1    Number of Visits 1    Date for PT Re-Evaluation 06/26/22    PT Start Time 0804    PT Stop Time 1001    PT Time Calculation (min) 117 min    Activity Tolerance Patient limited by pain             Past Medical History:  Diagnosis Date   Endometriosis    MS (multiple sclerosis) ([place])    MS (multiple sclerosis) ([place])     Past Surgical History:  Procedure Laterality Date   ANKLE SURGERY Left    CESAREAN SECTION     x 2   TUBAL LIGATION      There were no vitals filed for this visit.          Plan - 06/26/22 1837     Clinical Impression Statement Overall Level of Work: Falls within the Sedentary range.  Exerting up to 10 pounds of force occasionally (Occasionally: activity or condition exist up to 1/3 or the time) and/or a negligible amount of force frequently (Frequently: activity or condition exist from 1/3 to 2/3 or the time) to lift, carry, push, pull, or otherwise move objects, including the human body.  Sedentary work involves sitting most of the time, but may involve walking or standing for brief periods of time.  Jobs are sedentary if walking and standing are required only occasionally and all other sedentary criteria are met.    Please see the Task Performance Table for specific abilities.  Tolerance for the 8-Hour Day: Due to the client's limited ability to sit, she would have to alternate between sitting and other tasks as listed in the task performance table to tolerate the Sedentary level of work for the 8-hour day/40-hour week.  Due to the limited sitting tolerance, however, the client will need to alternate among other  tasks listed in the task performance table to maximize work tolerance to the 8-hour day.    Stability/Clinical Decision Making Evolving/Moderate complexity    Clinical Decision Making Moderate    Rehab Potential Fair    PT Frequency One time visit    PT Treatment/Interventions ADLs/Self Care Home Management;Gait training;Stair training;Functional mobility training;Therapeutic activities;Therapeutic exercise;Balance training;Patient/family education;Neuromuscular re-education    PT Next Visit Plan [facility]CE only             Patient will benefit from skilled therapeutic intervention in order to improve the following deficits and impairments:  Abnormal gait, Decreased range of motion, Difficulty walking, Decreased endurance, Decreased activity tolerance, Pain, Improper body mechanics, Impaired flexibility, Decreased balance, Decreased mobility, Postural dysfunction  Visit Diagnosis: Neck pain  Multiple sclerosis ([place])  Difficulty in walking, not elsewhere classified     Problem List Patient Active Problem List   Diagnosis Date Noted   Neck pain 07/31/2020   High risk medication use 07/31/2020   Myalgia 07/31/2020   Multiple sclerosis ([place]) 12/22/2019   Gait disturbance 12/22/2019   Numbness 12/22/2019   Frequent headaches 12/22/2019   Urinary urgency 12/22/2019   CVA (cerebral vascular accident) ([place]) 07/15/2019   Pura Spice, PT, DPT # [phone] 06/26/2022, 6:39 PM  [place]  [facility] [address] [place], [place], [address] Phone: [phone]   Fax:  [phone]  Name: Christie Oconnor MRN: [id number] Date of Birth: 1986/07/04

## 2023-08-05 DIAGNOSIS — R200 Anesthesia of skin: Secondary | ICD-10-CM | POA: Diagnosis not present

## 2023-08-05 DIAGNOSIS — R202 Paresthesia of skin: Secondary | ICD-10-CM | POA: Insufficient documentation

## 2023-08-05 DIAGNOSIS — Z87891 Personal history of nicotine dependence: Secondary | ICD-10-CM | POA: Diagnosis not present

## 2023-08-05 DIAGNOSIS — D649 Anemia, unspecified: Secondary | ICD-10-CM | POA: Diagnosis not present

## 2023-08-05 DIAGNOSIS — R519 Headache, unspecified: Secondary | ICD-10-CM | POA: Diagnosis present

## 2023-08-06 ENCOUNTER — Other Ambulatory Visit: Payer: Self-pay

## 2023-08-06 ENCOUNTER — Encounter (HOSPITAL_COMMUNITY): Payer: Self-pay

## 2023-08-06 ENCOUNTER — Emergency Department (HOSPITAL_COMMUNITY): Payer: 59

## 2023-08-06 ENCOUNTER — Emergency Department (HOSPITAL_COMMUNITY)
Admission: EM | Admit: 2023-08-06 | Discharge: 2023-08-06 | Disposition: A | Discharge status: Home / Self Care | Payer: 59 | Attending: Student | Admitting: Student

## 2023-08-06 DIAGNOSIS — R519 Headache, unspecified: Secondary | ICD-10-CM

## 2023-08-06 DIAGNOSIS — R202 Paresthesia of skin: Secondary | ICD-10-CM

## 2023-08-06 LAB — CBC WITH DIFFERENTIAL/PLATELET
Abs Immature Granulocytes: 0.02 10*3/uL (ref 0.00–0.07)
Basophils Absolute: 0 10*3/uL (ref 0.0–0.1)
Basophils Relative: 0 %
Eosinophils Absolute: 0.3 10*3/uL (ref 0.0–0.5)
Eosinophils Relative: 3 %
HCT: 35.1 % — ABNORMAL LOW (ref 36.0–46.0)
Hemoglobin: 11.7 g/dL — ABNORMAL LOW (ref 12.0–15.0)
Immature Granulocytes: 0 %
Lymphocytes Relative: 28 %
Lymphs Abs: 2.7 10*3/uL (ref 0.7–4.0)
MCH: 31.4 pg (ref 26.0–34.0)
MCHC: 33.3 g/dL (ref 30.0–36.0)
MCV: 94.1 fL (ref 80.0–100.0)
Monocytes Absolute: 0.6 10*3/uL (ref 0.1–1.0)
Monocytes Relative: 6 %
Neutro Abs: 6.2 10*3/uL (ref 1.7–7.7)
Neutrophils Relative %: 63 %
Platelets: 295 10*3/uL (ref 150–400)
RBC: 3.73 MIL/uL — ABNORMAL LOW (ref 3.87–5.11)
RDW: 15.9 % — ABNORMAL HIGH (ref 11.5–15.5)
WBC: 9.8 10*3/uL (ref 4.0–10.5)
nRBC: 0 % (ref 0.0–0.2)

## 2023-08-06 LAB — COMPREHENSIVE METABOLIC PANEL
ALT: 23 U/L (ref 0–44)
AST: 29 U/L (ref 15–41)
Albumin: 3.8 g/dL (ref 3.5–5.0)
Alkaline Phosphatase: 44 U/L (ref 38–126)
Anion gap: 9 (ref 5–15)
BUN: 16 mg/dL (ref 6–20)
CO2: 23 mmol/L (ref 22–32)
Calcium: 8.5 mg/dL — ABNORMAL LOW (ref 8.9–10.3)
Chloride: 104 mmol/L (ref 98–111)
Creatinine, Ser: 0.7 mg/dL (ref 0.44–1.00)
GFR, Estimated: >60 mL/min (ref >60)
Glucose, Bld: 87 mg/dL (ref 70–99)
Potassium: 3.6 mmol/L (ref 3.5–5.1)
Sodium: 136 mmol/L (ref 135–145)
Total Bilirubin: 0.4 mg/dL (ref <1.2)
Total Protein: 7 g/dL (ref 6.5–8.1)

## 2023-08-06 MED ORDER — KETOROLAC TROMETHAMINE 15 MG/ML IJ SOLN
15.0000 mg | Freq: Once | INTRAMUSCULAR | Status: AC
  Administered 2023-08-06: 15 mg via INTRAVENOUS
  Filled 2023-08-06: qty 1

## 2023-08-06 MED ORDER — PROCHLORPERAZINE EDISYLATE 10 MG/2ML IJ SOLN
10.0000 mg | Freq: Once | INTRAMUSCULAR | Status: AC
  Administered 2023-08-06: 10 mg via INTRAVENOUS
  Filled 2023-08-06: qty 2

## 2023-08-06 MED ORDER — DIPHENHYDRAMINE HCL 50 MG/ML IJ SOLN
25.0000 mg | Freq: Once | INTRAMUSCULAR | Status: AC
  Administered 2023-08-06: 25 mg via INTRAVENOUS
  Filled 2023-08-06: qty 1

## 2023-08-06 NOTE — ED Provider Notes (Addendum)
[place] EMERGENCY DEPARTMENT AT [facility] Provider Note  CSN: [id number] Arrival date & time: 08/05/23 2354  Chief Complaint(s) Tingling and Numbness  HPI Christie RIVIELLO is a 37 y.o. female with PMH MS, previous CVA, endometriosis, frequent headaches who presents emergency department for evaluation of headache, facial numbness and hand numbness.  She states that her symptoms began at 7 AM this morning and she is worried that she is having an MS relapse.  She states that her "lesions are hurting" and that she has not been on any MS infusions over the last few months due to insurance issues.  She denies any vision loss on the left eye or eye pain.  She has full range of motion of the upper and lower extremities.  Denies chest pain, shortness of breath, abdominal pain, nausea, vomiting or other systemic symptoms.   Past Medical History Past Medical History:  Diagnosis Date   Endometriosis    MS (multiple sclerosis) (HCC)    MS (multiple sclerosis) (HCC)    Patient Active Problem List   Diagnosis Date Noted   Neck pain 07/31/2020   High risk medication use 07/31/2020   Myalgia 07/31/2020   Multiple sclerosis (HCC) 12/22/2019   Gait disturbance 12/22/2019   Numbness 12/22/2019   Frequent headaches 12/22/2019   Urinary urgency 12/22/2019   CVA (cerebral vascular accident) (HCC) 07/15/2019   Home Medication(s) Prior to Admission medications   Medication Sig Start Date End Date Taking? Authorizing Provider  amitriptyline (ELAVIL) 25 MG tablet Take 25 mg by mouth at bedtime. Patient not taking: Reported on 05/19/2021    [provider]  cephALEXin (KEFLEX) 500 MG capsule Take 1 capsule (500 mg total) by mouth 2 (two) times daily. 10/24/21   Gilda Crease, MD  cyclobenzaprine (FLEXERIL) 5 MG tablet Take 1 tablet (5 mg total) by mouth 3 (three) times daily as needed. 10/20/20   Menshew, Charlesetta Ivory, PA-C  gabapentin (NEURONTIN) 300 MG capsule One po  qAM, one po qPM and two po qHS 07/31/20   Sater, Pearletha Furl, MD  ibuprofen (ADVIL) 800 MG tablet Take 1 tablet by mouth as needed.  Patient not taking: Reported on 05/19/2021 10/28/15   [provider]  imipramine (TOFRANIL) 25 MG tablet Take 25 mg by mouth at bedtime.    [provider]  natalizumab (TYSABRI) 300 MG/15ML injection Inject into the vein. Patient not taking: Reported on 05/19/2021    [provider]  nortriptyline (PAMELOR) 10 MG capsule Take 1 capsule (10 mg total) by mouth 2 (two) times daily. 03/17/20   Sater, Pearletha Furl, MD  ondansetron (ZOFRAN) 4 MG tablet Take 1 tablet (4 mg total) by mouth every 6 (six) hours. 10/24/21   Gilda Crease, MD  oxyCODONE-acetaminophen (PERCOCET) 5-325 MG tablet Take 1 tablet by mouth every 4 (four) hours as needed for severe pain. 05/19/21   Minna Antis, MD  sertraline (ZOLOFT) 50 MG tablet Take 50 mg by mouth daily. 05/15/20   [provider]  Vitamin D, Ergocalciferol, (DRISDOL) 1.25 MG (50000 UNIT) CAPS capsule Take 1 capsule (50,000 Units total) by mouth every 7 (seven) days. Patient not taking: Reported on 05/19/2021 12/22/19   Asa Lente, MD  Past Surgical History Past Surgical History:  Procedure Laterality Date   ANKLE SURGERY Left    CESAREAN SECTION     x 2   TUBAL LIGATION     Family History Family History  Problem Relation Age of Onset   Multiple sclerosis Neg Hx     Social History Social History   Tobacco Use   Smoking status: Former   Smokeless tobacco: Never   Tobacco comments:    6 cigarettes per day  Vaping Use   Vaping status: Never Used  Substance Use Topics   Alcohol use: Not Currently    Comment: occ   Drug use: Not Currently   Allergies Patient has no known allergies.  Review of Systems Review of Systems  Neurological:   Positive for numbness and headaches.    Physical Exam Vital Signs  I have reviewed the triage vital signs BP (!) 126/94   Pulse 67   Temp 98.3 F (36.8 C)   Resp 16   SpO2 98%   Physical Exam Vitals and nursing note reviewed.  Constitutional:      General: She is not in acute distress.    Appearance: She is well-developed.  HENT:     Head: Normocephalic and atraumatic.  Eyes:     Conjunctiva/sclera: Conjunctivae normal.  Cardiovascular:     Rate and Rhythm: Normal rate and regular rhythm.     Heart sounds: No murmur heard. Pulmonary:     Effort: Pulmonary effort is normal. No respiratory distress.     Breath sounds: Normal breath sounds.  Abdominal:     Palpations: Abdomen is soft.     Tenderness: There is no abdominal tenderness.  Musculoskeletal:        General: No swelling.     Cervical back: Neck supple.  Skin:    General: Skin is warm and dry.     Capillary Refill: Capillary refill takes less than 2 seconds.  Neurological:     Mental Status: She is alert.     Cranial Nerves: No cranial nerve deficit.     Sensory: No sensory deficit.     Motor: No weakness.  Psychiatric:        Mood and Affect: Mood normal.     ED Results and Treatments Labs (all labs ordered are listed, but only abnormal results are displayed) Labs Reviewed  COMPREHENSIVE METABOLIC PANEL - Abnormal; Notable for the following components:      Result Value   Calcium 8.5 (*)    All other components within normal limits  CBC WITH DIFFERENTIAL/PLATELET - Abnormal; Notable for the following components:   RBC 3.73 (*)    Hemoglobin 11.7 (*)    HCT 35.1 (*)    RDW 15.9 (*)    All other components within normal limits  Radiology CT Head Wo Contrast  Result Date: 08/06/2023 CLINICAL DATA:  Left-sided numbness and tingling. EXAM: CT HEAD WITHOUT CONTRAST TECHNIQUE:  Contiguous axial images were obtained from the base of the skull through the vertex without intravenous contrast. RADIATION DOSE REDUCTION: This exam was performed according to the departmental dose-optimization program which includes automated exposure control, adjustment of the mA and/or kV according to patient size and/or use of iterative reconstruction technique. COMPARISON:  May 19, 2021 FINDINGS: Brain: No evidence of acute infarction, hemorrhage, hydrocephalus, extra-axial collection or mass lesion/mass effect. Vascular: No hyperdense vessel or unexpected calcification. Skull: Normal. Negative for fracture or focal lesion. Sinuses/Orbits: No acute finding. Other: None. IMPRESSION: Normal head CT. Electronically Signed   By: Aram Candela M.D.   On: 08/06/2023 03:20    Pertinent labs & imaging results that were available during my care of the patient were reviewed by me and considered in my medical decision making (see MDM for details).  Medications Ordered in ED Medications  prochlorperazine (COMPAZINE) injection 10 mg (10 mg Intravenous Given 08/06/23 0112)  diphenhydrAMINE (BENADRYL) injection 25 mg (25 mg Intravenous Given 08/06/23 0115)  ketorolac (TORADOL) 15 MG/ML injection 15 mg (15 mg Intravenous Given 08/06/23 0114)                                                                                                                                     Procedures Procedures  (including critical care time)  Medical Decision Making / ED Course   This patient presents to the ED for concern of headache, paresthesias, this involves an extensive number of treatment options, and is a complaint that carries with it a high risk of complications and morbidity.  The differential diagnosis includes complicated migraine, CVA, intracranial bleed, MS flare  MDM: Patient seen emergency room for evaluation of headache with paresthesias.  Physical exam is largely unremarkable with no appreciable  sensory deficit, no motor deficit, no cranial nerve deficit on exam.  Laboratory evaluation with anemia to 11.7 but is otherwise unremarkable.  CT head unremarkable.  I unfortunately do not have access to an MRI machine overnight and with a normal neurologic exam I have higher suspicion for complicated migraine than I do for MS flare.  Thus we treated with a headache cocktail and on reevaluation her symptoms have completely resolved including all of her paresthesias.  I had an extended discussion with the patient that we did not evaluate for MS flare today as we do not have the capability to obtain this scan in the ER, and thus there is a risk of [place] missing flaring MS but this is lower in the setting of resolution of symptoms after treating for migraine.  They voiced understanding of this and we used shared decision making to defer transfer to [facility] for MRI today and instead the patient will be discharged with outpatient neurology follow-up and if her symptoms were to return including  any neurologic symptoms including eye pain, loss of vision, recurrent headaches, numbness or weakness she will go to [facility] for MRI imaging.  With symptoms resolved, she does not meet inpatient criteria for admission and will be discharged with outpatient follow-up.  Very strict return precautions as described above were discussed with the patient and her family and patient discharged with outpatient follow-up  Additional history obtained: -Additional history obtained from cousin -External records from outside source obtained and reviewed including: Chart review including previous notes, labs, imaging, consultation notes   Lab Tests: -I ordered, reviewed, and interpreted labs.   The pertinent results include:   Labs Reviewed  COMPREHENSIVE METABOLIC PANEL - Abnormal; Notable for the following components:      Result Value   Calcium 8.5 (*)    All other components within normal limits  CBC WITH DIFFERENTIAL/PLATELET -  Abnormal; Notable for the following components:   RBC 3.73 (*)    Hemoglobin 11.7 (*)    HCT 35.1 (*)    RDW 15.9 (*)    All other components within normal limits       Imaging Studies ordered: I ordered imaging studies including CT head I independently visualized and interpreted imaging. I agree with the radiologist interpretation   Medicines ordered and prescription drug management: Meds ordered this encounter  Medications   prochlorperazine (COMPAZINE) injection 10 mg   diphenhydrAMINE (BENADRYL) injection 25 mg   ketorolac (TORADOL) 15 MG/ML injection 15 mg    -I have reviewed the patients home medicines and have made adjustments as needed  Critical interventions None  Cardiac Monitoring: The patient was maintained on a cardiac monitor.  I personally viewed and interpreted the cardiac monitored which showed an underlying rhythm of: NSR  Social Determinants of Health:  Factors impacting patients care include: Having difficulties with insurance and thus has not followed up with [facility] for her MS infusions   Reevaluation: After the interventions noted above, I reevaluated the patient and found that they have :improved  Co morbidities that complicate the patient evaluation  Past Medical History:  Diagnosis Date   Endometriosis    MS (multiple sclerosis) (HCC)    MS (multiple sclerosis) (HCC)       Dispostion: I considered admission for this patient, but at this time with symptoms resolved she does not meet inpatient criteria for admission and will be discharged with outpatient follow-up     Final Clinical Impression(s) / ED Diagnoses Final diagnoses:  Acute nonintractable headache, unspecified headache type  Paresthesias     @PCDICTATION @    Zeva Leber, Wyn Forster, MD 08/06/23 0448    Glendora Score, MD 08/06/23 [phone]

## 2023-08-06 NOTE — ED Notes (Signed)
Patient transported to CT 

## 2023-08-06 NOTE — ED Triage Notes (Addendum)
Pt with hx of MS presents with numbness, tingling on the left side of her body. States that lesions on the right side head are hurting which are causing a headache. LKW 0700. Pt states that this is usually how she feels when she is having a MS relapse.   Hx of stroke October 2021.

## 2024-06-11 ENCOUNTER — Other Ambulatory Visit: Payer: Self-pay | Admitting: Physician Assistant

## 2024-06-11 DIAGNOSIS — M542 Cervicalgia: Secondary | ICD-10-CM

## 2024-06-11 DIAGNOSIS — R296 Repeated falls: Secondary | ICD-10-CM

## 2024-06-11 DIAGNOSIS — G8929 Other chronic pain: Secondary | ICD-10-CM

## 2024-06-11 DIAGNOSIS — G35 Multiple sclerosis: Secondary | ICD-10-CM

## 2024-06-11 DIAGNOSIS — R200 Anesthesia of skin: Secondary | ICD-10-CM

## 2024-06-11 DIAGNOSIS — R569 Unspecified convulsions: Secondary | ICD-10-CM

## 2024-06-11 DIAGNOSIS — R531 Weakness: Secondary | ICD-10-CM

## 2024-06-17 ENCOUNTER — Ambulatory Visit
Admission: RE | Admit: 2024-06-17 | Discharge: 2024-06-17 | Disposition: A | Discharge status: Home / Self Care | Source: Ambulatory Visit | Attending: Physician Assistant | Admitting: Physician Assistant

## 2024-06-17 DIAGNOSIS — M542 Cervicalgia: Secondary | ICD-10-CM

## 2024-06-17 DIAGNOSIS — G8929 Other chronic pain: Secondary | ICD-10-CM

## 2024-06-17 DIAGNOSIS — R569 Unspecified convulsions: Secondary | ICD-10-CM

## 2024-06-17 DIAGNOSIS — G35 Multiple sclerosis: Secondary | ICD-10-CM

## 2024-06-17 DIAGNOSIS — R531 Weakness: Secondary | ICD-10-CM

## 2024-06-17 DIAGNOSIS — R296 Repeated falls: Secondary | ICD-10-CM

## 2024-06-17 DIAGNOSIS — M5442 Lumbago with sciatica, left side: Secondary | ICD-10-CM | POA: Diagnosis present

## 2024-06-17 DIAGNOSIS — R202 Paresthesia of skin: Secondary | ICD-10-CM | POA: Insufficient documentation

## 2024-06-17 DIAGNOSIS — R200 Anesthesia of skin: Secondary | ICD-10-CM | POA: Diagnosis present

## 2024-06-17 DIAGNOSIS — M546 Pain in thoracic spine: Secondary | ICD-10-CM | POA: Diagnosis present

## 2024-06-17 MED ORDER — GADOBUTROL 1 MMOL/ML IV SOLN
7.5000 mL | Freq: Once | INTRAVENOUS | Status: AC | PRN
  Administered 2024-06-17: 7.5 mL via INTRAVENOUS

## 2024-07-02 ENCOUNTER — Emergency Department (HOSPITAL_COMMUNITY)
Admission: EM | Admit: 2024-07-02 | Discharge: 2024-07-03 | Disposition: A | Discharge status: Home / Self Care | Attending: Emergency Medicine | Admitting: Emergency Medicine

## 2024-07-02 ENCOUNTER — Encounter (HOSPITAL_COMMUNITY): Payer: Self-pay

## 2024-07-02 ENCOUNTER — Emergency Department (HOSPITAL_COMMUNITY)

## 2024-07-02 DIAGNOSIS — S161XXA Strain of muscle, fascia and tendon at neck level, initial encounter: Secondary | ICD-10-CM | POA: Diagnosis not present

## 2024-07-02 DIAGNOSIS — S29019A Strain of muscle and tendon of unspecified wall of thorax, initial encounter: Secondary | ICD-10-CM | POA: Insufficient documentation

## 2024-07-02 DIAGNOSIS — S0990XA Unspecified injury of head, initial encounter: Secondary | ICD-10-CM | POA: Diagnosis present

## 2024-07-02 DIAGNOSIS — S060X0A Concussion without loss of consciousness, initial encounter: Secondary | ICD-10-CM | POA: Insufficient documentation

## 2024-07-02 DIAGNOSIS — Y92410 Unspecified street and highway as the place of occurrence of the external cause: Secondary | ICD-10-CM | POA: Insufficient documentation

## 2024-07-02 DIAGNOSIS — S0083XA Contusion of other part of head, initial encounter: Secondary | ICD-10-CM | POA: Insufficient documentation

## 2024-07-02 DIAGNOSIS — Z7982 Long term (current) use of aspirin: Secondary | ICD-10-CM | POA: Diagnosis not present

## 2024-07-02 MED ORDER — HYDROCODONE-ACETAMINOPHEN 5-325 MG PO TABS
2.0000 | ORAL_TABLET | Freq: Once | ORAL | Status: AC
  Administered 2024-07-02: 2 via ORAL
  Filled 2024-07-02: qty 2

## 2024-07-02 MED ORDER — ONDANSETRON 4 MG PO TBDP
4.0000 mg | ORAL_TABLET | Freq: Once | ORAL | Status: AC
  Administered 2024-07-02: 4 mg via ORAL
  Filled 2024-07-02: qty 1

## 2024-07-02 NOTE — ED Notes (Addendum)
 Pt stated she is in 10/10 back pain due to her bulging discs in her back. EDP made aware.
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
## 2024-07-03 ENCOUNTER — Emergency Department (HOSPITAL_COMMUNITY)

## 2024-07-03 DIAGNOSIS — S060X0A Concussion without loss of consciousness, initial encounter: Secondary | ICD-10-CM | POA: Diagnosis not present

## 2024-07-03 LAB — HCG, SERUM, QUALITATIVE: Preg, Serum: NEGATIVE

## 2024-07-03 MED ORDER — HYDROCODONE-ACETAMINOPHEN 5-325 MG PO TABS
1.0000 | ORAL_TABLET | Freq: Once | ORAL | Status: AC
  Administered 2024-07-03: 1 via ORAL
  Filled 2024-07-03: qty 1

## 2024-07-03 NOTE — ED Notes (Signed)
 Pt unable to provide urine at this time

## 2024-07-03 NOTE — ED Notes (Signed)
 Pt returned from CT

## 2024-07-03 NOTE — ED Notes (Signed)
Pt ambulated to restroom with family. 

## 2024-07-13 ENCOUNTER — Encounter (HOSPITAL_COMMUNITY): Payer: Self-pay | Admitting: Emergency Medicine

## 2024-07-13 ENCOUNTER — Other Ambulatory Visit: Payer: Self-pay

## 2024-07-13 ENCOUNTER — Emergency Department (HOSPITAL_COMMUNITY)
Admission: EM | Admit: 2024-07-13 | Discharge: 2024-07-13 | Discharge status: Against Medical Advice | Attending: Emergency Medicine | Admitting: Emergency Medicine

## 2024-07-13 DIAGNOSIS — F1010 Alcohol abuse, uncomplicated: Secondary | ICD-10-CM | POA: Diagnosis present

## 2024-07-13 DIAGNOSIS — R45851 Suicidal ideations: Secondary | ICD-10-CM | POA: Diagnosis not present

## 2024-07-13 DIAGNOSIS — Z5321 Procedure and treatment not carried out due to patient leaving prior to being seen by health care provider: Secondary | ICD-10-CM | POA: Insufficient documentation

## 2024-07-13 DIAGNOSIS — Y906 Blood alcohol level of 120-199 mg/100 ml: Secondary | ICD-10-CM | POA: Insufficient documentation

## 2024-07-13 LAB — COMPREHENSIVE METABOLIC PANEL WITH GFR
ALT: 39 U/L (ref 0–44)
AST: 34 U/L (ref 15–41)
Albumin: 4.7 g/dL (ref 3.5–5.0)
Alkaline Phosphatase: 57 U/L (ref 38–126)
Anion gap: 17 — ABNORMAL HIGH (ref 5–15)
BUN: 9 mg/dL (ref 6–20)
CO2: 22 mmol/L (ref 22–32)
Calcium: 9.3 mg/dL (ref 8.9–10.3)
Chloride: 105 mmol/L (ref 98–111)
Creatinine, Ser: 0.8 mg/dL (ref 0.44–1.00)
GFR, Estimated: >60 mL/min (ref >60)
Glucose, Bld: 88 mg/dL (ref 70–99)
Potassium: 3.8 mmol/L (ref 3.5–5.1)
Sodium: 144 mmol/L (ref 135–145)
Total Bilirubin: 0.2 mg/dL (ref 0.0–1.2)
Total Protein: 8 g/dL (ref 6.5–8.1)

## 2024-07-13 LAB — CBC
HCT: 42.9 % (ref 36.0–46.0)
Hemoglobin: 14.8 g/dL (ref 12.0–15.0)
MCH: 33.7 pg (ref 26.0–34.0)
MCHC: 34.5 g/dL (ref 30.0–36.0)
MCV: 97.7 fL (ref 80.0–100.0)
Platelets: 347 K/uL (ref 150–400)
RBC: 4.39 MIL/uL (ref 3.87–5.11)
RDW: 14.1 % (ref 11.5–15.5)
WBC: 9 K/uL (ref 4.0–10.5)
nRBC: 0 % (ref 0.0–0.2)

## 2024-07-13 LAB — ETHANOL: Alcohol, Ethyl (B): 154 mg/dL — ABNORMAL HIGH (ref <15)

## 2024-07-13 NOTE — ED Notes (Signed)
 Pt seen by security walking down hall and out of department. Pt not seen in department at this time. Pt was voluntary. Will inform EDP.
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
